# Patient Record
Sex: Female | Born: 1960 | Race: White | Hispanic: No | Marital: Married | Smoking: Never smoker
Health system: Southern US, Community
[De-identification: ages and names within clinical notes are randomized; demographics above are authoritative.]

## PROBLEM LIST (undated history)

## (undated) DIAGNOSIS — A048 Other specified bacterial intestinal infections: Secondary | ICD-10-CM

## (undated) DIAGNOSIS — K279 Peptic ulcer, site unspecified, unspecified as acute or chronic, without hemorrhage or perforation: Secondary | ICD-10-CM

## (undated) DIAGNOSIS — B9681 Helicobacter pylori [H. pylori] as the cause of diseases classified elsewhere: Secondary | ICD-10-CM

## (undated) DIAGNOSIS — F419 Anxiety disorder, unspecified: Secondary | ICD-10-CM

## (undated) DIAGNOSIS — K589 Irritable bowel syndrome without diarrhea: Secondary | ICD-10-CM

## (undated) HISTORY — DX: Other specified bacterial intestinal infections: A04.8

## (undated) HISTORY — DX: Irritable bowel syndrome, unspecified: K58.9

## (undated) HISTORY — DX: Anxiety disorder, unspecified: F41.9

## (undated) HISTORY — DX: Helicobacter pylori (H. pylori) as the cause of diseases classified elsewhere: B96.81

## (undated) HISTORY — DX: Peptic ulcer, site unspecified, unspecified as acute or chronic, without hemorrhage or perforation: K27.9

---

## 1998-05-03 ENCOUNTER — Other Ambulatory Visit: Admission: RE | Admit: 1998-05-03 | Discharge: 1998-05-03 | Payer: Self-pay | Admitting: Obstetrics and Gynecology

## 1999-01-23 ENCOUNTER — Inpatient Hospital Stay (HOSPITAL_COMMUNITY): Admission: AD | Admit: 1999-01-23 | Discharge: 1999-01-25 | Payer: Self-pay | Admitting: Obstetrics and Gynecology

## 1999-04-10 ENCOUNTER — Other Ambulatory Visit: Admission: RE | Admit: 1999-04-10 | Discharge: 1999-04-10 | Payer: Self-pay | Admitting: Obstetrics and Gynecology

## 1999-05-14 ENCOUNTER — Encounter: Payer: Self-pay | Admitting: Gastroenterology

## 1999-05-14 ENCOUNTER — Ambulatory Visit (HOSPITAL_COMMUNITY): Admission: RE | Admit: 1999-05-14 | Discharge: 1999-05-14 | Payer: Self-pay | Admitting: Gastroenterology

## 2000-01-04 ENCOUNTER — Other Ambulatory Visit: Admission: RE | Admit: 2000-01-04 | Discharge: 2000-01-04 | Payer: Self-pay | Admitting: Obstetrics and Gynecology

## 2000-06-17 ENCOUNTER — Other Ambulatory Visit: Admission: RE | Admit: 2000-06-17 | Discharge: 2000-06-17 | Payer: Self-pay | Admitting: *Deleted

## 2001-02-10 ENCOUNTER — Encounter: Admission: RE | Admit: 2001-02-10 | Discharge: 2001-02-10 | Payer: Self-pay | Admitting: Gastroenterology

## 2001-02-10 ENCOUNTER — Encounter: Payer: Self-pay | Admitting: Gastroenterology

## 2001-03-03 ENCOUNTER — Ambulatory Visit (HOSPITAL_COMMUNITY): Admission: RE | Admit: 2001-03-03 | Discharge: 2001-03-03 | Payer: Self-pay | Admitting: Gastroenterology

## 2001-03-03 ENCOUNTER — Encounter: Payer: Self-pay | Admitting: Gastroenterology

## 2001-07-15 ENCOUNTER — Other Ambulatory Visit: Admission: RE | Admit: 2001-07-15 | Discharge: 2001-07-15 | Payer: Self-pay | Admitting: Obstetrics and Gynecology

## 2002-06-02 ENCOUNTER — Other Ambulatory Visit: Admission: RE | Admit: 2002-06-02 | Discharge: 2002-06-02 | Payer: Self-pay | Admitting: Obstetrics and Gynecology

## 2002-06-12 ENCOUNTER — Encounter (INDEPENDENT_AMBULATORY_CARE_PROVIDER_SITE_OTHER): Payer: Self-pay | Admitting: Specialist

## 2002-06-12 ENCOUNTER — Ambulatory Visit (HOSPITAL_COMMUNITY): Admission: RE | Admit: 2002-06-12 | Discharge: 2002-06-12 | Payer: Self-pay | Admitting: Obstetrics and Gynecology

## 2003-06-09 ENCOUNTER — Inpatient Hospital Stay (HOSPITAL_COMMUNITY): Admission: AD | Admit: 2003-06-09 | Discharge: 2003-06-11 | Payer: Self-pay | Admitting: Obstetrics and Gynecology

## 2003-07-19 ENCOUNTER — Other Ambulatory Visit: Admission: RE | Admit: 2003-07-19 | Discharge: 2003-07-19 | Payer: Self-pay | Admitting: Obstetrics and Gynecology

## 2004-10-31 ENCOUNTER — Encounter: Admission: RE | Admit: 2004-10-31 | Discharge: 2004-10-31 | Payer: Self-pay | Admitting: Obstetrics and Gynecology

## 2005-11-27 ENCOUNTER — Encounter: Admission: RE | Admit: 2005-11-27 | Discharge: 2005-11-27 | Payer: Self-pay | Admitting: Obstetrics and Gynecology

## 2005-12-23 ENCOUNTER — Encounter: Admission: RE | Admit: 2005-12-23 | Discharge: 2005-12-23 | Payer: Self-pay | Admitting: Obstetrics and Gynecology

## 2010-05-31 ENCOUNTER — Encounter: Admission: RE | Admit: 2010-05-31 | Discharge: 2010-05-31 | Payer: Self-pay | Admitting: Obstetrics and Gynecology

## 2010-08-26 ENCOUNTER — Encounter: Payer: Self-pay | Admitting: Obstetrics and Gynecology

## 2010-11-08 ENCOUNTER — Other Ambulatory Visit: Payer: Self-pay | Admitting: Obstetrics and Gynecology

## 2010-12-21 NOTE — H&P (Signed)
   NAME:  Cheryl Sims, GREAVES NO.:  [id number]   MEDICAL RECORD NO.:  [id number]                   PATIENT TYPE:  INP   LOCATION:  9164                                 FACILITY:  [facility]   PHYSICIAN:  Lenoard Aden, M.D.             DATE OF BIRTH:  [birth year]   DATE OF ADMISSION:  06/09/2003  DATE OF DISCHARGE:                                HISTORY & PHYSICAL   CHIEF COMPLAINT:  Rule out large for gestational age for induction.   HISTORY OF PRESENT ILLNESS:  The patient is a 50 year old white female, G3,  P1 with an EDD of June 11, 2003, at 39+ weeks, who presents with probable  large for gestational age fetus for induction.   ALLERGIES:  No known drug allergies.   MEDICATIONS:  Prenatal vitamins.   PAST MEDICAL HISTORY:  1. History of a 7-pound female born in June of 200.  2. A missed AB with D&E in November of 2003.  3. Remote history of HPV.  4. Remote history of depression.  5. Vocal cord surgery in 1984.   FAMILY HISTORY:  Ulcerative disease, thyroid disease, and lupus.   PRENATAL LABORATORY DATA:  Blood type O positive.  Rubella immune.  Hepatitis and HIV negative.   PHYSICAL EXAMINATION:  GENERAL APPEARANCE:  She is a well-developed, well-  nourished, white female in no apparent distress.  HEENT:  Normal.  LUNGS:  Clear.  HEART:  Regular rate and rhythm.  ABDOMEN:  Soft, gravid, and nontender.  Estimated fetal weight 8-1/2 pounds.  PELVIC:  The cervix is 2 cm, 50%, vertex, and -2.  EXTREMITIES:  No cords.  NEUROLOGIC:  Exam is nonfocal.   IMPRESSION:  1. 39-week intrauterine pregnancy.  2. Rule out large for gestational age fetus.    PLAN:  Proceed with Pitocin induction.  The risks and benefits were  discussed.  The risks of anesthesia, infection, and bleeding are noted.  The  patient acknowledges and wishes to proceed.                                               Lenoard Aden, M.D.    RJT/MEDQ  D:  06/09/2003  T:   06/09/2003  Job:  [id number]

## 2010-12-21 NOTE — H&P (Signed)
   NAME:  Cheryl Sims, DAME                            ACCOUNT NO.:  [id number]   MEDICAL RECORD NO.:  [id number]                   PATIENT TYPE:  AMB   LOCATION:  SDC                                  FACILITY:  [facility]   PHYSICIAN:  Lenoard Aden, M.D.             DATE OF BIRTH:  05/29/1961   DATE OF ADMISSION:  06/12/2002  DATE OF DISCHARGE:                                HISTORY & PHYSICAL   CHIEF COMPLAINT:  Missed AB at 10 weeks.   HISTORY OF PRESENT ILLNESS:  The patient is a 50 year old white female G2 P1  at [redacted] weeks gestation with an 8-week anembryonic pregnancy and enlarged yolk  sac.   PAST MEDICAL HISTORY:  Spontaneous vaginal delivery in 2000; no other  medical or surgical hospitalizations.  History of cryosurgery in 1991.  History of ulcer treated with medication.   FAMILY HISTORY:  Lupus, hyperglycemia, and tachycardia.   PRENATAL LABORATORIES:  Prenatal blood type reveals a blood type of O  positive.   MEDICATIONS:  Prenatal vitamins.   ALLERGIES:  No known drug allergies.   PHYSICAL EXAMINATION:  GENERAL: Well-developed, well-nourished, white female  in no apparent distress.  HEENT: Normal.  LUNGS: Clear.  HEART: Regular rhythm.  ABDOMEN: Soft, nontender.  PELVIC: Exam reveals an 8-week-sized uterus and no adnexal masses.   IMPRESSION:  Missed abortion.   PLAN:  Proceed with suction D&E.  Risks of anesthesia, infection, bleeding,  injury to abdominal organs, need for repairs discussed, delayed versus  immediate complications to include bowel and bladder injury noted.  The  patient acknowledges and desires to proceed.                                               Lenoard Aden, M.D.    RJT/MEDQ  D:  06/12/2002  T:  06/12/2002  Job:  [id number]   cc:   [facility]

## 2010-12-21 NOTE — Op Note (Signed)
   NAME:  Cheryl Sims, Cheryl Sims                            ACCOUNT NO.:  [id number]   MEDICAL RECORD NO.:  [id number]                   PATIENT TYPE:  AMB   LOCATION:  SDC                                  FACILITY:  [facility]   PHYSICIAN:  Lenoard Aden, M.D.             DATE OF BIRTH:  1960/09/09   DATE OF PROCEDURE:  06/12/2002  DATE OF DISCHARGE:  06/12/2002                                 OPERATIVE REPORT   PREOPERATIVE DIAGNOSIS:  Ten-week intrauterine pregnancy with missed  abortion.   POSTOPERATIVE DIAGNOSIS:  Ten-week intrauterine pregnancy with missed  abortion.   PROCEDURE:  Suction dilatation and evacuation.   SURGEON:  Lenoard Aden, M.D.   ANESTHESIA:  MAC, paracervical.   ESTIMATED BLOOD LOSS:  50 cc.   COMPLICATIONS:  None.   SPECIMENS:  Products of conception to pathology.   DISPOSITION:  To recovery in good condition.   DESCRIPTION OF PROCEDURE:  After being apprised of the risks of anesthesia,  infection, bleeding, injury to abdominal organs and need for repair, the  patient was brought to the operating room, where she was administered IV  sedation without difficulty, prepped and draped in the usual sterile  fashion, and catheterized until the bladder was empty.  After achieving  adequate anesthesia, the bivalve speculum placed, single-tooth tenaculum  used to grasp the anterior lip of the cervix, paracervical block placed  using 20 cc of a dilute Xylocaine solution.  The uterus sounds to 8 cm,  dilated easily up to a #25 Pratt dilator.  Suction curettage performed  without difficulty.  Products of conception noted.  Repeat suction and four-  quadrant curettage confirms the emptiness of the cavity.  Good hemostasis  noted.  All instruments are removed.  Exam under anesthesia reveals a small  midpositioned uterus, no adnexal masses.  The patient tolerates the  procedure well and is transferred to recovery in good condition.              Lenoard Aden, M.D.    RJT/MEDQ  D:  06/12/2002  T:  06/14/2002  Job:  [id number]

## 2011-11-14 ENCOUNTER — Other Ambulatory Visit: Payer: Self-pay | Admitting: Obstetrics and Gynecology

## 2011-11-14 DIAGNOSIS — Z1231 Encounter for screening mammogram for malignant neoplasm of breast: Secondary | ICD-10-CM

## 2011-11-20 ENCOUNTER — Ambulatory Visit
Admission: RE | Admit: 2011-11-20 | Discharge: 2011-11-20 | Disposition: A | Discharge status: Home / Self Care | Payer: BC Managed Care – PPO | Source: Ambulatory Visit | Attending: Obstetrics and Gynecology | Admitting: Obstetrics and Gynecology

## 2011-11-20 DIAGNOSIS — Z1231 Encounter for screening mammogram for malignant neoplasm of breast: Secondary | ICD-10-CM

## 2011-11-27 ENCOUNTER — Ambulatory Visit: Payer: Self-pay

## 2012-12-22 ENCOUNTER — Other Ambulatory Visit: Payer: Self-pay

## 2012-12-22 DIAGNOSIS — Z1231 Encounter for screening mammogram for malignant neoplasm of breast: Secondary | ICD-10-CM

## 2012-12-25 ENCOUNTER — Ambulatory Visit
Admission: RE | Admit: 2012-12-25 | Discharge: 2012-12-25 | Disposition: A | Discharge status: Home / Self Care | Payer: 59 | Source: Ambulatory Visit

## 2012-12-25 DIAGNOSIS — Z1231 Encounter for screening mammogram for malignant neoplasm of breast: Secondary | ICD-10-CM

## 2016-08-19 DIAGNOSIS — E559 Vitamin D deficiency, unspecified: Secondary | ICD-10-CM | POA: Diagnosis not present

## 2016-08-19 DIAGNOSIS — Z13220 Encounter for screening for lipoid disorders: Secondary | ICD-10-CM | POA: Diagnosis not present

## 2016-08-19 DIAGNOSIS — Z0000 Encounter for general adult medical examination without abnormal findings: Secondary | ICD-10-CM | POA: Diagnosis not present

## 2016-08-19 DIAGNOSIS — Z1159 Encounter for screening for other viral diseases: Secondary | ICD-10-CM | POA: Diagnosis not present

## 2016-08-19 DIAGNOSIS — Z79899 Other long term (current) drug therapy: Secondary | ICD-10-CM | POA: Diagnosis not present

## 2016-12-10 DIAGNOSIS — L739 Follicular disorder, unspecified: Secondary | ICD-10-CM | POA: Diagnosis not present

## 2016-12-10 DIAGNOSIS — L0211 Cutaneous abscess of neck: Secondary | ICD-10-CM | POA: Diagnosis not present

## 2016-12-10 DIAGNOSIS — C61 Malignant neoplasm of prostate: Secondary | ICD-10-CM | POA: Diagnosis not present

## 2016-12-10 DIAGNOSIS — L738 Other specified follicular disorders: Secondary | ICD-10-CM | POA: Diagnosis not present

## 2016-12-10 DIAGNOSIS — B9689 Other specified bacterial agents as the cause of diseases classified elsewhere: Secondary | ICD-10-CM | POA: Diagnosis not present

## 2017-04-11 DIAGNOSIS — S60461A Insect bite (nonvenomous) of left index finger, initial encounter: Secondary | ICD-10-CM | POA: Diagnosis not present

## 2017-08-04 DIAGNOSIS — Z1231 Encounter for screening mammogram for malignant neoplasm of breast: Secondary | ICD-10-CM | POA: Diagnosis not present

## 2017-08-04 DIAGNOSIS — Z6820 Body mass index (BMI) 20.0-20.9, adult: Secondary | ICD-10-CM | POA: Diagnosis not present

## 2017-08-04 DIAGNOSIS — Z01419 Encounter for gynecological examination (general) (routine) without abnormal findings: Secondary | ICD-10-CM | POA: Diagnosis not present

## 2017-09-29 DIAGNOSIS — E559 Vitamin D deficiency, unspecified: Secondary | ICD-10-CM | POA: Diagnosis not present

## 2017-09-29 DIAGNOSIS — Z0000 Encounter for general adult medical examination without abnormal findings: Secondary | ICD-10-CM | POA: Diagnosis not present

## 2017-09-29 DIAGNOSIS — Z79899 Other long term (current) drug therapy: Secondary | ICD-10-CM | POA: Diagnosis not present

## 2017-10-07 DIAGNOSIS — D2261 Melanocytic nevi of right upper limb, including shoulder: Secondary | ICD-10-CM | POA: Diagnosis not present

## 2017-10-07 DIAGNOSIS — D225 Melanocytic nevi of trunk: Secondary | ICD-10-CM | POA: Diagnosis not present

## 2017-10-07 DIAGNOSIS — D2262 Melanocytic nevi of left upper limb, including shoulder: Secondary | ICD-10-CM | POA: Diagnosis not present

## 2017-10-21 DIAGNOSIS — D72829 Elevated white blood cell count, unspecified: Secondary | ICD-10-CM | POA: Diagnosis not present

## 2017-10-24 ENCOUNTER — Telehealth: Payer: Self-pay | Admitting: Hematology and Oncology

## 2017-10-24 NOTE — Telephone Encounter (Signed)
Appt has been scheduled for the pt to see Dr. Lebron Conners on 4/1 at 2pm. Pt aware to arrive 30 minutes early.

## 2017-11-03 ENCOUNTER — Encounter: Payer: Self-pay | Admitting: Hematology and Oncology

## 2017-11-03 ENCOUNTER — Inpatient Hospital Stay: Payer: Commercial Managed Care - PPO

## 2017-11-03 ENCOUNTER — Inpatient Hospital Stay: Payer: Commercial Managed Care - PPO | Attending: Hematology and Oncology | Admitting: Hematology and Oncology

## 2017-11-03 ENCOUNTER — Other Ambulatory Visit: Payer: Self-pay

## 2017-11-03 VITALS — BP 128/88 | HR 70 | Temp 98.2°F | Resp 18 | Ht 68.5 in | Wt 137.0 lb

## 2017-11-03 DIAGNOSIS — K279 Peptic ulcer, site unspecified, unspecified as acute or chronic, without hemorrhage or perforation: Secondary | ICD-10-CM | POA: Diagnosis not present

## 2017-11-03 DIAGNOSIS — K589 Irritable bowel syndrome without diarrhea: Secondary | ICD-10-CM | POA: Diagnosis not present

## 2017-11-03 DIAGNOSIS — C9110 Chronic lymphocytic leukemia of B-cell type not having achieved remission: Secondary | ICD-10-CM | POA: Diagnosis present

## 2017-11-03 DIAGNOSIS — B9689 Other specified bacterial agents as the cause of diseases classified elsewhere: Secondary | ICD-10-CM | POA: Insufficient documentation

## 2017-11-03 DIAGNOSIS — Z79899 Other long term (current) drug therapy: Secondary | ICD-10-CM | POA: Diagnosis not present

## 2017-11-03 DIAGNOSIS — D72820 Lymphocytosis (symptomatic): Secondary | ICD-10-CM

## 2017-11-03 DIAGNOSIS — B9681 Helicobacter pylori [H. pylori] as the cause of diseases classified elsewhere: Secondary | ICD-10-CM | POA: Diagnosis not present

## 2017-11-03 DIAGNOSIS — F419 Anxiety disorder, unspecified: Secondary | ICD-10-CM | POA: Diagnosis not present

## 2017-11-03 LAB — CMP (CANCER CENTER ONLY)
ALK PHOS: 63 U/L (ref 40–150)
ALT: 16 U/L (ref 0–55)
ANION GAP: 8 (ref 3–11)
AST: 22 U/L (ref 5–34)
Albumin: 4.5 g/dL (ref 3.5–5.0)
BILIRUBIN TOTAL: 0.6 mg/dL (ref 0.2–1.2)
BUN: 10 mg/dL (ref 7–26)
CALCIUM: 9.8 mg/dL (ref 8.4–10.4)
CO2: 27 mmol/L (ref 22–29)
Chloride: 106 mmol/L (ref 98–109)
Creatinine: 0.75 mg/dL (ref 0.60–1.10)
Glucose, Bld: 82 mg/dL (ref 70–140)
Potassium: 3.8 mmol/L (ref 3.5–5.1)
SODIUM: 141 mmol/L (ref 136–145)
TOTAL PROTEIN: 7.1 g/dL (ref 6.4–8.3)

## 2017-11-03 LAB — CBC WITH DIFFERENTIAL (CANCER CENTER ONLY)
BASOS ABS: 0.1 10*3/uL (ref 0.0–0.1)
BASOS PCT: 0 %
EOS ABS: 0.1 10*3/uL (ref 0.0–0.5)
EOS PCT: 1 %
HCT: 43 % (ref 34.8–46.6)
HEMOGLOBIN: 14.2 g/dL (ref 11.6–15.9)
LYMPHS ABS: 15.4 10*3/uL — AB (ref 0.9–3.3)
Lymphocytes Relative: 79 %
MCH: 29.8 pg (ref 25.1–34.0)
MCHC: 32.9 g/dL (ref 31.5–36.0)
MCV: 90.6 fL (ref 79.5–101.0)
Monocytes Absolute: 0.6 10*3/uL (ref 0.1–0.9)
Monocytes Relative: 3 %
Neutro Abs: 3.2 10*3/uL (ref 1.5–6.5)
Neutrophils Relative %: 17 %
PLATELETS: 297 10*3/uL (ref 145–400)
RBC: 4.75 MIL/uL (ref 3.70–5.45)
RDW: 12.5 % (ref 11.2–14.5)
WBC: 19.4 10*3/uL — AB (ref 3.9–10.3)

## 2017-11-03 LAB — LACTATE DEHYDROGENASE: LDH: 182 U/L (ref 125–245)

## 2017-11-03 LAB — URIC ACID: Uric Acid, Serum: 3 mg/dL (ref 2.6–7.4)

## 2017-11-03 NOTE — Progress Notes (Addendum)
[name] New Visit:  Assessment: Lymphocytosis 57 y.o. female with reasonably limited past medical history, but recent bout of social stress and episode of severe staphylococcal folliculitis in September 2018 who is referred to our clinic for elevated white blood cell count.  Review of the available lab work demonstrates isolated lymphocytic leukocytosis suspicious for possible CLL development.  Plan: -Lab work as outlined below. -CT of the neck/chest/abdomen/pelvis to assess for lymphadenopathy and splenomegaly. -Return to clinic in 2 weeks to review the findings.   Voice recognition software was used and creation of this note. Despite my best effort at editing the text, some misspelling/errors may have occurred. Orders Placed This Encounter  Procedures  . CT Abdomen Pelvis W Contrast    Standing Status:   Future    Standing Expiration Date:   11/03/2018    Order Specific Question:   If indicated for the ordered procedure, I authorize the administration of contrast media per Radiology protocol    Answer:   Yes    Order Specific Question:   Is patient pregnant?    Answer:   No    Order Specific Question:   Preferred imaging location?    Answer:   [facility]    Order Specific Question:   Radiology Contrast Protocol - do NOT remove file path    Answer:   \\[email or web address]    Order Specific Question:   Reason for Exam additional comments    Answer:   Lymphocytosis, possible CLL, please eval for lymphadenopathy and splenomegaly  . CT Chest W Contrast    Standing Status:   Future    Standing Expiration Date:   11/03/2018    Order Specific Question:   If indicated for the ordered procedure, I authorize the administration of contrast media per Radiology protocol    Answer:   Yes    Order Specific Question:   Is patient pregnant?    Answer:   No    Order Specific Question:   Preferred imaging location?    Answer:   [facility]    Order Specific Question:   Radiology Contrast Protocol - do NOT remove file path    Answer:   [email or web address]    Order Specific Question:   Reason for Exam additional comments    Answer:   Lymphocytosis, possible CLL, please eval for lymphadenopathy and splenomegaly  . CT Soft Tissue Neck W Contrast    Standing Status:   Future    Standing Expiration Date:   11/03/2018    Order Specific Question:   If indicated for the ordered procedure, I authorize the administration of contrast media per Radiology protocol    Answer:   Yes    Order Specific Question:   Is patient pregnant?    Answer:   No    Order Specific Question:   Preferred imaging location?    Answer:   [facility]    Order Specific Question:   Radiology Contrast Protocol - do NOT remove file path    Answer:   [email or web address]    Order Specific Question:   Reason for Exam additional comments    Answer:   Lymphocytosis, possible CLL, please eval for lymphadenopathy and splenomegaly  . CBC with Differential (Cancer Center Only)    Standing Status:   Future    Number of Occurrences:   1    Standing Expiration Date:   11/04/2018  . CMP ([facility] only)    Standing  Status:   Future    Number of Occurrences:   1    Standing Expiration Date:   11/04/2018  . Lactate dehydrogenase (LDH)    Standing Status:   Future    Number of Occurrences:   1    Standing Expiration Date:   11/03/2018  . Uric acid    Standing Status:   Future    Number of Occurrences:   1    Standing Expiration Date:   11/03/2018  . Beta 2 microglobulin, serum  . QIG  (Quant. immunoglobulins  - IgG, IgA, IgM)    Standing Status:   Future    Number of Occurrences:   1    Standing Expiration Date:   11/03/2018  . Flow Cytometry    Possible CLL    Standing Status:   Future    Number of Occurrences:   1    Standing Expiration Date:   11/03/2018  . FISH, CLL Prognostic Panel    Standing  Status:   Future    Number of Occurrences:   1    Standing Expiration Date:   11/04/2018  . Chromosome analysis, peripheral blood    Standing Status:   Future    Number of Occurrences:   1    Standing Expiration Date:   11/04/2018    Order Specific Question:   Clinical Indication    Answer:   Possible CLL    Order Specific Question:   Referring Physician Name:    Answer:   Lebron Conners    Order Specific Question:   Patient ID:    Answer:   [id number]  . Hepatitis B surface antibody    Standing Status:   Future    Number of Occurrences:   1    Standing Expiration Date:   11/03/2018  . Hepatitis B surface antigen    Standing Status:   Future    Number of Occurrences:   1    Standing Expiration Date:   11/03/2018  . Hepatitis B core antibody, total    Standing Status:   Future    Number of Occurrences:   1    Standing Expiration Date:   11/03/2018  . Pathologist smear review    Standing Status:   Future    Standing Expiration Date:   11/03/2018    All questions were answered.  . The patient knows to call the clinic with any problems, questions or concerns.  This note was electronically signed.    History of Presenting Illness Cheryl Sims 57 y.o. presenting to the [facility] for elevated white blood cell count evaluation, referred by Dr [name].  Patient's past medical history is significant for anxiety, irritable bowel syndrome, history of peptic ulcer disease positive for H. Pylori.  Elevated white blood cell count was found on routine lab work assessment.  At the present time, patient denies any fevers, chills, night sweats.  She has been feeling more tired than usual, but denies any loss of weight.  Her appetite is not excellent at baseline attributable to long-term habits as well as recent social stress that she has been experiencing including grieving process for her mother who died to 3 years ago, taking care of her father who is diagnosed with progressive Alzheimer's, also  taking care of her brother who has intermittent alcohol abuse problems in addition to job-related stress as well as raising 2 teenagers.  Patient reports developing a severely pruritic rash in September which was eventually diagnosed  as staphylococcal folliculitis and required antibiotic therapy.  Subsequently, rash resolved, but patient now has somewhat recurrent threshold weight is significantly different from the previous presentation.  She has not seen her dermatologist for this problem yet.  Patient had enlarged lymph nodes in the left neck when she had folliculitis, but no recurrent lymphadenopathy presently in the neck, armpits, or groin.  Patient denies abdominal pain or diarrhea.  Oncological/hematological History: **Lymphocytic leukocytosis: --Labs, 08/19/16: WBC   9.8,        Hgb 15.8, plt 262; HCV -- negative --Labs, 09/29/17: WBC 16.9,         Hgb 14.2, Plt 310; TSH 2.06 --Labs, 10/21/17: WBC 15.3, ANC 2.0, ALC 12.4, Mono 0.7, Eos 0.2, Baso 0.0,  Hgb 14.3, Plt 292;  Medical History: Past Medical History:  Diagnosis Date  . Anxiety   . H. pylori infection   . IBS (irritable bowel syndrome)   . Peptic ulcer due to Helicobacter pylori     Surgical History: History reviewed. No pertinent surgical history.  Family History: Family History  Problem Relation Age of Onset  . Alzheimer's disease Father   . Thyroid cancer Brother     Social History: Social History   Socioeconomic History  . Marital status: Married    Spouse name: Not on file  . Number of children: Not on file  . Years of education: Not on file  . Highest education level: Not on file  Occupational History  . Not on file  Social Needs  . Financial resource strain: Not on file  . Food insecurity:    Worry: Not on file    Inability: Not on file  . Transportation needs:    Medical: Not on file    Non-medical: Not on file  Tobacco Use  . Smoking status: Never Smoker  . Smokeless tobacco: Never Used   Substance and Sexual Activity  . Alcohol use: Yes    Frequency: Never  . Drug use: Never  . Sexual activity: Yes  Lifestyle  . Physical activity:    Days per week: Not on file    Minutes per session: Not on file  . Stress: Not on file  Relationships  . Social connections:    Talks on phone: Not on file    Gets together: Not on file    Attends religious service: Not on file    Active member of club or organization: Not on file    Attends meetings of clubs or organizations: Not on file    Relationship status: Not on file  . Intimate partner violence:    Fear of current or ex partner: Not on file    Emotionally abused: Not on file    Physically abused: Not on file    Forced sexual activity: Not on file  Other Topics Concern  . Not on file  Social History Narrative  . Not on file    Allergies: No Known Allergies  Medications:  No current outpatient medications on file.   No current facility-administered medications for this visit.     Review of Systems: Review of Systems  Constitutional: Positive for appetite change and fatigue. Negative for diaphoresis and unexpected weight change.  Skin: Positive for rash.  Hematological: Negative for adenopathy. Does not bruise/bleed easily.  All other systems reviewed and are negative.    PHYSICAL EXAMINATION Blood pressure 128/88, pulse 70, temperature 98.2 F (36.8 C), temperature source Oral, resp. rate 18, height 5' 8.5" (1.74 m), weight 137 lb (62.1  kg), SpO2 100 %.  ECOG PERFORMANCE STATUS: 0 - Asymptomatic  Physical Exam  Constitutional: She is oriented to person, place, and time and well-developed, well-nourished, and in no distress. No distress.  HENT:  Head: Normocephalic and atraumatic.  Mouth/Throat: Oropharynx is clear and moist. No oropharyngeal exudate.  Eyes: Pupils are equal, round, and reactive to light. Conjunctivae and EOM are normal. No scleral icterus.  Neck: No thyromegaly present.  Cardiovascular:  Normal rate, regular rhythm and normal heart sounds.  No murmur heard. Pulmonary/Chest: Effort normal and breath sounds normal. No respiratory distress. She has no wheezes. She has no rales.  Abdominal: Soft. Bowel sounds are normal. She exhibits no distension and no mass. There is no tenderness. There is no guarding.  Musculoskeletal: She exhibits no edema.  Lymphadenopathy:    She has no cervical adenopathy.  Neurological: She is alert and oriented to person, place, and time. She has normal reflexes. No cranial nerve deficit.  Skin: Skin is warm and dry. No rash noted. She is not diaphoretic. No erythema. No pallor.     LABORATORY DATA: I have personally reviewed the data as listed: Appointment on 11/03/2017  Component Date Value Ref Range Status  . WBC Count 11/03/2017 19.4* 3.9 - 10.3 K/uL Final  . RBC 11/03/2017 4.75  3.70 - 5.45 MIL/uL Final  . Hemoglobin 11/03/2017 14.2  11.6 - 15.9 g/dL Final  . HCT 11/03/2017 43.0  34.8 - 46.6 % Final  . MCV 11/03/2017 90.6  79.5 - 101.0 fL Final  . MCH 11/03/2017 29.8  25.1 - 34.0 pg Final  . MCHC 11/03/2017 32.9  31.5 - 36.0 g/dL Final  . RDW 11/03/2017 12.5  11.2 - 14.5 % Final  . Platelet Count 11/03/2017 297  145 - 400 K/uL Final  . Smear Review 11/03/2017 PENDING   Incomplete  . Neutrophils Relative % 11/03/2017 17  % Final  . Neutro Abs 11/03/2017 3.2  1.5 - 6.5 K/uL Final  . Lymphocytes Relative 11/03/2017 79  % Final  . Lymphs Abs 11/03/2017 15.4* 0.9 - 3.3 K/uL Final  . Monocytes Relative 11/03/2017 3  % Final  . Monocytes Absolute 11/03/2017 0.6  0.1 - 0.9 K/uL Final  . Eosinophils Relative 11/03/2017 1  % Final  . Eosinophils Absolute 11/03/2017 0.1  0.0 - 0.5 K/uL Final  . Basophils Relative 11/03/2017 0  % Final  . Basophils Absolute 11/03/2017 0.1  0.0 - 0.1 K/uL Final   Performed at [facility], [place] [address] [place], [place] [address]         Ardath Sax, MD

## 2017-11-03 NOTE — Assessment & Plan Note (Signed)
57 y.o. female with reasonably limited past medical history, but recent bout of social stress and episode of severe staphylococcal folliculitis in September 2018 who is referred to our clinic for elevated white blood cell count.  Review of the available lab work demonstrates isolated lymphocytic leukocytosis suspicious for possible CLL development.  Plan: -Lab work as outlined below. -CT of the neck/chest/abdomen/pelvis to assess for lymphadenopathy and splenomegaly. -Return to clinic in 2 weeks to review the findings.

## 2017-11-04 LAB — IGG, IGA, IGM
IGA: 196 mg/dL (ref 87–352)
IGM (IMMUNOGLOBULIN M), SRM: 43 mg/dL (ref 26–217)
IgG (Immunoglobin G), Serum: 791 mg/dL (ref 700–1600)

## 2017-11-04 LAB — HEPATITIS B CORE ANTIBODY, TOTAL: Hep B Core Total Ab: NEGATIVE

## 2017-11-04 LAB — HEPATITIS B SURFACE ANTIBODY,QUALITATIVE: HEP B S AB: NONREACTIVE

## 2017-11-04 LAB — HEPATITIS B SURFACE ANTIGEN: HEP B S AG: NEGATIVE

## 2017-11-04 LAB — BETA 2 MICROGLOBULIN, SERUM: Beta-2 Microglobulin: 1.3 mg/L (ref 0.6–2.4)

## 2017-11-05 LAB — FLOW CYTOMETRY

## 2017-11-10 LAB — FISH,CLL PROGNOSTIC PANEL

## 2017-11-13 ENCOUNTER — Ambulatory Visit (HOSPITAL_COMMUNITY)
Admission: RE | Admit: 2017-11-13 | Discharge: 2017-11-13 | Disposition: A | Discharge status: Home / Self Care | Payer: Commercial Managed Care - PPO | Source: Ambulatory Visit | Attending: Hematology and Oncology | Admitting: Hematology and Oncology

## 2017-11-13 ENCOUNTER — Ambulatory Visit (HOSPITAL_COMMUNITY): Payer: Commercial Managed Care - PPO

## 2017-11-13 DIAGNOSIS — D72820 Lymphocytosis (symptomatic): Secondary | ICD-10-CM

## 2017-11-13 DIAGNOSIS — I700 Atherosclerosis of aorta: Secondary | ICD-10-CM | POA: Insufficient documentation

## 2017-11-13 DIAGNOSIS — R599 Enlarged lymph nodes, unspecified: Secondary | ICD-10-CM | POA: Diagnosis not present

## 2017-11-13 IMAGING — CT CT CHEST W/ CM
4 of 9 series · 15 of 36 positions shown, 17 images · IV contrast (APPLIED)
Comparison: None.

CLINICAL DATA: Lymphocytosis. Abnormal white blood cells on routine
blood work. CLL workup.

EXAM:
CT CHEST, ABDOMEN, AND PELVIS WITH CONTRAST
TECHNIQUE: Multidetector CT imaging of the chest, abdomen and pelvis was
performed following the standard protocol during bolus
administration of intravenous contrast.
CONTRAST:  100mL OMNIPAQUE IOHEXOL 300 MG/ML  SOLN

[Series 2: cap with · axial · 0.73mm/px · z∈[-437,+3]mm · 5 of 134 slices shown, 7 images]
[im 23/134  mediastinal]
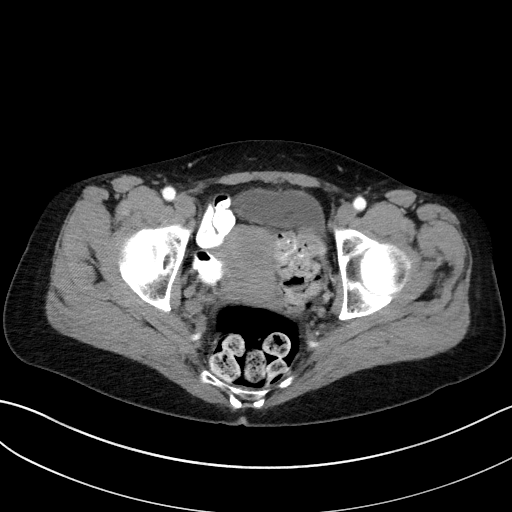
[im 23/134  lung]
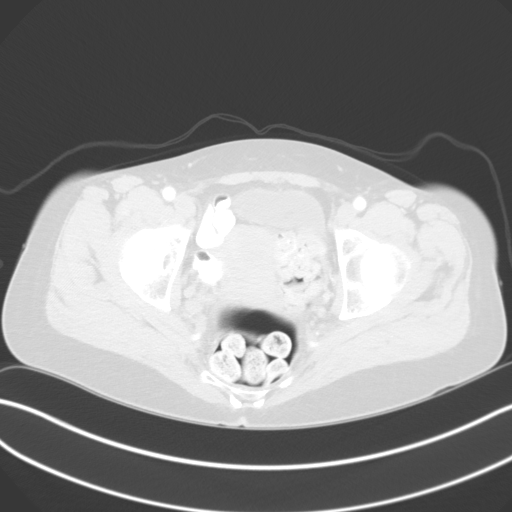
[im 45/134  lung]
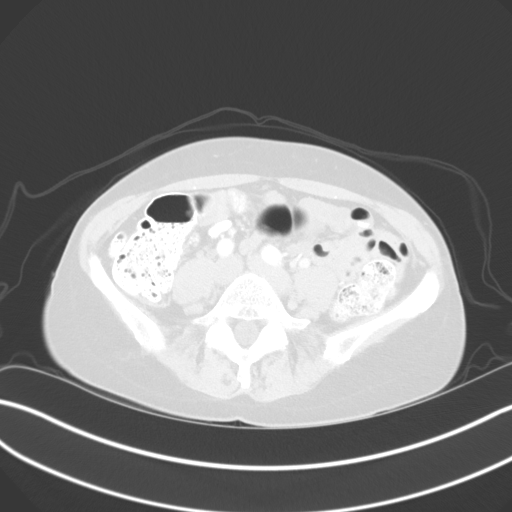
[im 67/134  lung]
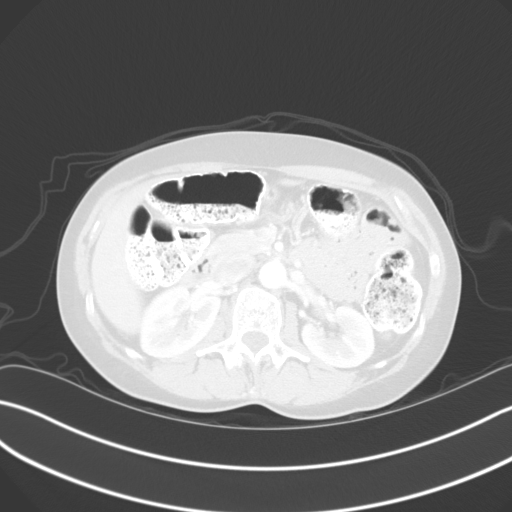
[im 89/134  lung]
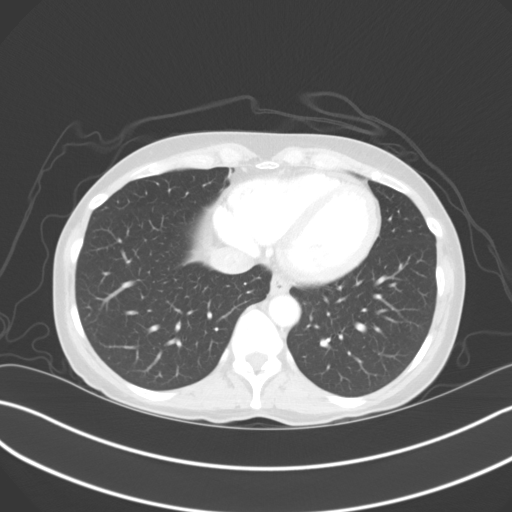
[im 111/134  mediastinal]
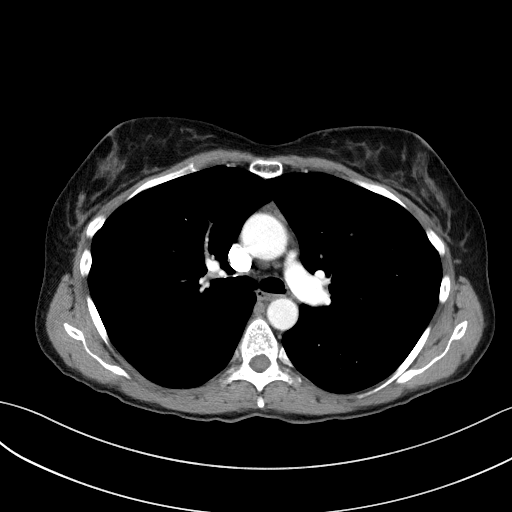
[im 111/134  lung]
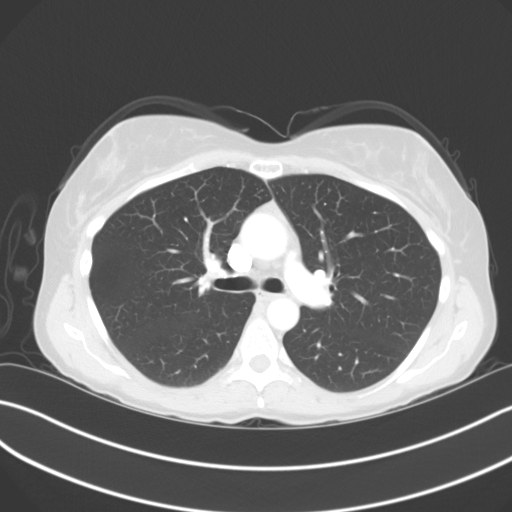

[Series 7: lung · axial · 0.73mm/px · z∈[-147,+53]mm · 5 of 152 slices shown]
[im 26/152  lung]
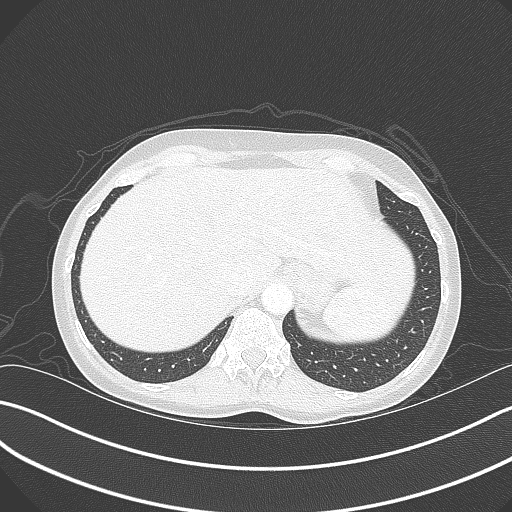
[im 51/152  lung]
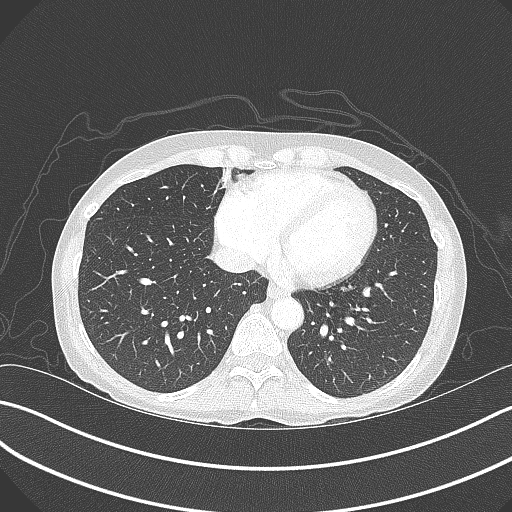
[im 76/152  lung]
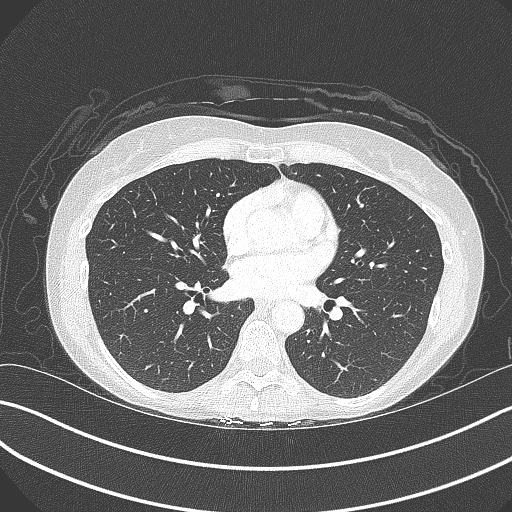
[im 101/152  lung]
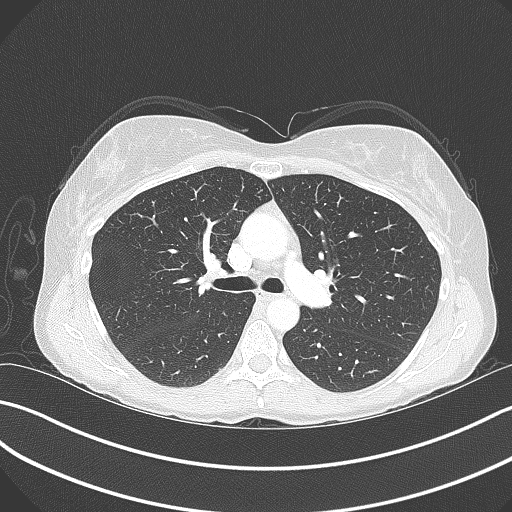
[im 126/152  lung]
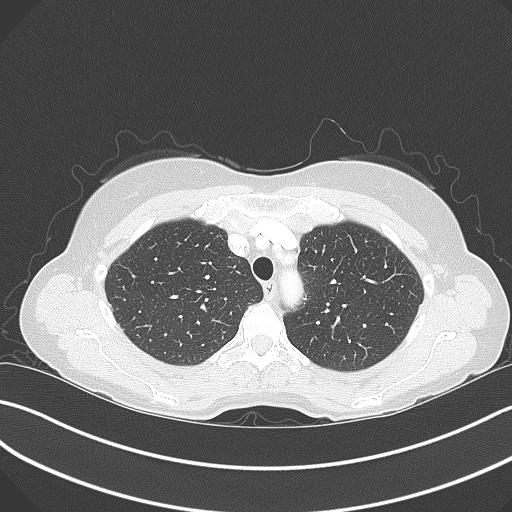

[Series 8: axial neck · axial · 0.42mm/px · z∈[+76,+220]mm · 4 of 121 slices shown]
[im 25/121  lung]
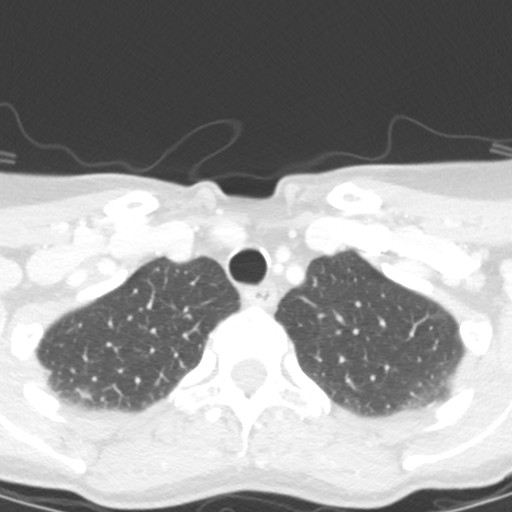
[im 49/121  lung]
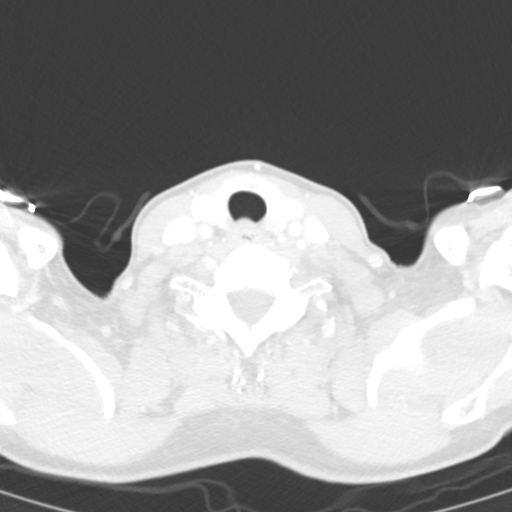
[im 73/121  lung]
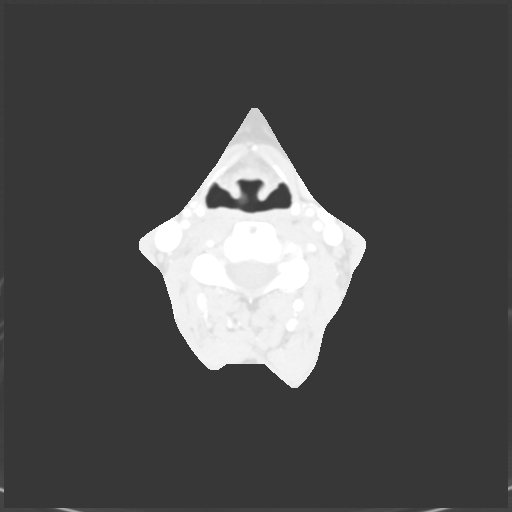
[im 97/121  lung]
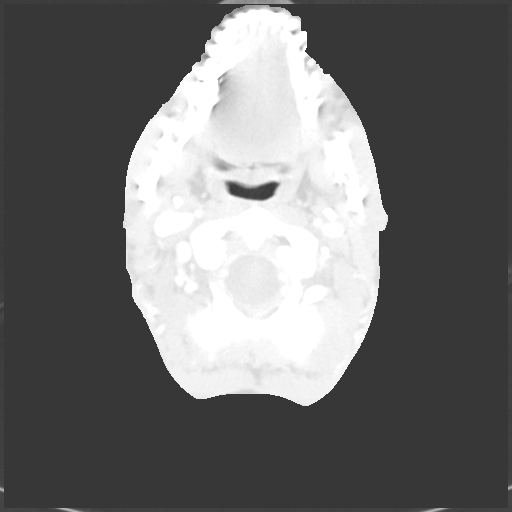

[Series 12: cor neck · coronal · 0.54mm/px · 1 of 84 slices shown]
[im 42/84  lung]
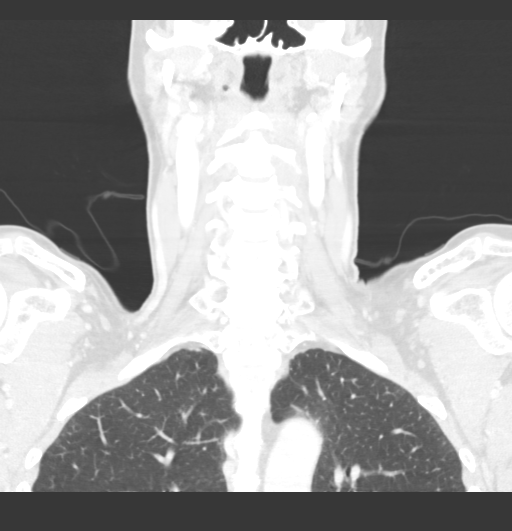

[15 of 36 positions shown; findings below may reference images not displayed]

FINDINGS: CT CHEST FINDINGS

Cardiovascular: The heart is normal in size. No pericardial
effusion. The aorta is normal in caliber. No dissection. The branch
vessels are patent. No coronary artery calcifications. The pulmonary
arteries appear normal.

Mediastinum/Nodes: No mediastinal or hilar mass or lymphadenopathy.
The esophagus is grossly normal.

Lungs/Pleura: No acute pulmonary findings or worrisome pulmonary
lesions. No interstitial lung disease or bronchiectasis.

Musculoskeletal: No breast masses, supraclavicular or axillary
lymphadenopathy. Small scattered lymph nodes are noted. The
visualized thyroid gland is grossly normal.

No significant bony findings.

CT ABDOMEN PELVIS FINDINGS

Hepatobiliary: No focal hepatic lesions or intrahepatic biliary
dilatation. The gallbladder is normal. No common bile duct
dilatation.

Pancreas: No mass, inflammation or ductal dilatation. 4 mm
low-attenuation lesion noted in the body region (series 2, image 64
and series 5, image 39. This is likely a benign postinflammatory
cyst but will require followup.

Spleen: Normal size.  No focal lesions.

Adrenals/Urinary Tract: The adrenal glands, kidneys, ureters and
bladder are unremarkable.

Stomach/Bowel: The stomach, duodenum, small bowel and colon are
unremarkable. No acute inflammatory changes, mass lesions or
obstructive findings. Moderate to large amount of stool throughout
the colon and down into the rectum suggesting constipation. High
transverse cecum noted. The terminal ileum is normal. The appendix
is not identified.

Vascular/Lymphatic: The aorta and branch vessels are patent. The
major venous structures are patent. No mesenteric or retroperitoneal
mass or adenopathy.

No pelvic or inguinal lymphadenopathy. Prominent parametrial vessels
may suggest pelvic congestion syndrome.

Reproductive: The uterus and ovaries are unremarkable.

Other: No inguinal mass or adenopathy. No abdominal wall hernia or
subcutaneous lesions.

Musculoskeletal: No significant bony findings. Moderate degenerative
disc disease noted at L5-S1.
IMPRESSION: 1. No evidence of adenopathy in the chest, abdomen or pelvis.
2. No acute findings or mass lesions in the abdomen/pelvis.
3. No significant pulmonary findings or pulmonary lesions.

## 2017-11-13 MED ORDER — IOHEXOL 300 MG/ML  SOLN
100.0000 mL | Freq: Once | INTRAMUSCULAR | Status: AC | PRN
  Administered 2017-11-13: 100 mL via INTRAVENOUS

## 2017-11-17 ENCOUNTER — Telehealth: Payer: Self-pay

## 2017-11-17 ENCOUNTER — Inpatient Hospital Stay (HOSPITAL_BASED_OUTPATIENT_CLINIC_OR_DEPARTMENT_OTHER): Payer: Commercial Managed Care - PPO | Admitting: Hematology and Oncology

## 2017-11-17 VITALS — BP 132/90 | HR 61 | Temp 98.2°F | Resp 18 | Ht 68.5 in | Wt 136.7 lb

## 2017-11-17 DIAGNOSIS — B9681 Helicobacter pylori [H. pylori] as the cause of diseases classified elsewhere: Secondary | ICD-10-CM | POA: Diagnosis not present

## 2017-11-17 DIAGNOSIS — C9110 Chronic lymphocytic leukemia of B-cell type not having achieved remission: Secondary | ICD-10-CM | POA: Diagnosis not present

## 2017-11-17 DIAGNOSIS — K589 Irritable bowel syndrome without diarrhea: Secondary | ICD-10-CM | POA: Diagnosis not present

## 2017-11-17 NOTE — Telephone Encounter (Signed)
Printed avs and calender of upcoming appointment. Per 4/15 los 

## 2017-12-07 DIAGNOSIS — C9110 Chronic lymphocytic leukemia of B-cell type not having achieved remission: Secondary | ICD-10-CM | POA: Insufficient documentation

## 2017-12-07 NOTE — Assessment & Plan Note (Signed)
57 y.o. female with limited past medical history presently with confirmed new diagnosis of chronic lymphocytic leukemia, Rai stage 0, good prognosis based on isolated presence of bi-allelic 03O deletion.  Patient is asymptomatic.  At this time, no treatment is indicated.  Plan: -Initiate observation. -Return to clinic in 3 months with labs for progression monitoring.  Repeat imaging will be obtained on appearance of new symptoms.

## 2017-12-07 NOTE — Progress Notes (Signed)
[facility] Follow-up Visit:  Assessment: CLL (chronic lymphocytic leukemia) ([place]) 57 y.o. female with limited past medical history presently with confirmed new diagnosis of chronic lymphocytic leukemia, Rai stage 0, good prognosis based on isolated presence of bi-allelic 53Z deletion.  Patient is asymptomatic.  At this time, no treatment is indicated.  Plan: -Initiate observation. -Return to clinic in 3 months with labs for progression monitoring.  Repeat imaging will be obtained on appearance of new symptoms.   Voice recognition software was used and creation of this note. Despite my best effort at editing the text, some misspelling/errors may have occurred.  Orders Placed This Encounter  Procedures  . CBC with Differential (Cancer Center Only)    Standing Status:   Future    Standing Expiration Date:   11/18/2018  . CMP ([place] only)    Standing Status:   Future    Standing Expiration Date:   11/18/2018  . Lactate dehydrogenase (LDH)    Standing Status:   Future    Standing Expiration Date:   11/17/2018  . QIG  (Quant. immunoglobulins  - IgG, IgA, IgM)    Standing Status:   Future    Standing Expiration Date:   11/17/2018    Cancer Staging No matching staging information was found for the patient.  All questions were answered.  . The patient knows to call the clinic with any problems, questions or concerns.  This note was electronically signed.    History of Presenting Illness LOREENA Sims is a 57 y.o. female follwed in the [place] for diagnosis of chronic lymphocytic leukemia, referred by Dr [name].  Patient's past medical history is significant for anxiety, irritable bowel syndrome, history of peptic ulcer disease positive for H. Pylori.  Elevated white blood cell count was found on routine lab work assessment.  At the present time, patient denies any fevers, chills, night sweats.  She has been feeling more tired than usual, but  denies any loss of weight.  Her appetite is not excellent at baseline attributable to long-term habits as well as recent social stress that she has been experiencing including grieving process for her mother who died to 3 years ago, taking care of her father who is diagnosed with progressive Alzheimer's, also taking care of her brother who has intermittent alcohol abuse problems in addition to job-related stress as well as raising 2 teenagers.  Patient reports developing a severely pruritic rash in September which was eventually diagnosed as staphylococcal folliculitis and required antibiotic therapy.  Subsequently, rash resolved, but patient now has somewhat recurrent threshold weight is significantly different from the previous presentation.  She has not seen her dermatologist for this problem yet.  Patient had enlarged lymph nodes in the left neck when she had folliculitis, but no recurrent lymphadenopathy presently in the neck, armpits, or groin.  Patient denies abdominal pain or diarrhea.  Oncological/hematological History: **Chronic lymphocytic leukemia: --Labs, 08/19/16: WBC   9.8,                                                                                      Hgb 15.8, plt 262; HCV --  negative --Labs, 09/29/17: WBC 16.9,                                                                                      Hgb 14.2, Plt 310; TSH 2.06 --Labs, 10/21/17: WBC 15.3, ANC 2.0, ALC 12.4, Mono 0.7, Eos 0.2, Baso 0.0,    Hgb 14.3, Plt 292;     CLL (chronic lymphocytic leukemia) ([place])   11/03/2017 Pathology Results    pBlood Flow Cytometry: Positive for monoclonal lymphoid population comprising 83% of lymphoid events.  Positive for CD20, CD23, and CD5 negative for CD10.  Consistent with chronic lymphocytic leukemia/small lymphocytic lymphoma CLL FISH: negative for trisomy 12, p53 deletion or amplification, ATM deletion.  Positive for [id number] (62% with bi-allelic deletion)      08/08/863 Tumor Marker     WBC 19.4, ALC 15.4, Hgb 14.2, Plt 294 LDH 182, beta-2 microglobulin 1.3; IgG 791      11/13/2017 Imaging    CT N/C/A/P: Increased number without pathological enlargement of lymph nodes in the neck, no pathological lymphadenopathy in the chest, abdomen, pelvis.  No splenomegaly.      11/17/2017 Initial Diagnosis    CLL (chronic lymphocytic leukemia) ([place])      11/17/2017 Cancer Staging    Staging form: Chronic Lymphocytic Leukemia / Small Lymphocytic Lymphoma, AJCC 8th Edition - Clinical stage from 11/17/2017: Modified Rai Stage 0 (Modified Rai risk: Low, Binet: Stage A, Lymphocytosis: Present, Adenopathy: Absent, Organomegaly: Absent, Anemia: Absent, Thrombocytopenia: Absent) - Signed by Ardath Sax, MD on 12/07/2017       Medical History: Past Medical History:  Diagnosis Date  . Anxiety   . H. pylori infection   . IBS (irritable bowel syndrome)   . Peptic ulcer due to Helicobacter pylori     Surgical History: No past surgical history on file.  Family History: Family History  Problem Relation Age of Onset  . Alzheimer's disease Father   . Thyroid cancer Brother     Social History: Social History   Socioeconomic History  . Marital status: Married    Spouse name: Not on file  . Number of children: Not on file  . Years of education: Not on file  . Highest education level: Not on file  Occupational History  . Not on file  Social Needs  . Financial resource strain: Not on file  . Food insecurity:    Worry: Not on file    Inability: Not on file  . Transportation needs:    Medical: Not on file    Non-medical: Not on file  Tobacco Use  . Smoking status: Never Smoker  . Smokeless tobacco: Never Used  Substance and Sexual Activity  . Alcohol use: Yes    Frequency: Never  . Drug use: Never  . Sexual activity: Yes  Lifestyle  . Physical activity:    Days per week: Not on file    Minutes per session: Not on file  . Stress: Not on file  Relationships  .  Social connections:    Talks on phone: Not on file    Gets together: Not on file    Attends religious service: Not on file  Active member of club or organization: Not on file    Attends meetings of clubs or organizations: Not on file    Relationship status: Not on file  . Intimate partner violence:    Fear of current or ex partner: Not on file    Emotionally abused: Not on file    Physically abused: Not on file    Forced sexual activity: Not on file  Other Topics Concern  . Not on file  Social History Narrative  . Not on file    Allergies: No Known Allergies  Medications:  Current Outpatient Medications  Medication Sig Dispense Refill  . ALPRAZolam (XANAX) 0.5 MG tablet TK 1/2 TO 1 T PO TID PRN  0   No current facility-administered medications for this visit.     Review of Systems: Review of Systems  Constitutional: Positive for appetite change and fatigue. Negative for diaphoresis and unexpected weight change.  Skin: Positive for rash.  Hematological: Negative for adenopathy. Does not bruise/bleed easily.  All other systems reviewed and are negative.    PHYSICAL EXAMINATION Blood pressure 132/90, pulse 61, temperature 98.2 F (36.8 C), temperature source Oral, resp. rate 18, height 5' 8.5" (1.74 m), weight 136 lb 11.2 oz (62 kg), SpO2 100 %.  ECOG PERFORMANCE STATUS: 1 - Symptomatic but completely ambulatory  Physical Exam  Constitutional: She is oriented to person, place, and time. She appears well-developed and well-nourished. No distress.  HENT:  Head: Normocephalic and atraumatic.  Mouth/Throat: Oropharynx is clear and moist. No oropharyngeal exudate.  Eyes: Pupils are equal, round, and reactive to light. Conjunctivae and EOM are normal. No scleral icterus.  Neck: No thyromegaly present.  Cardiovascular: Normal rate, regular rhythm, normal heart sounds and intact distal pulses. Exam reveals no gallop and no friction rub.  No murmur heard. Pulmonary/Chest:  Effort normal and breath sounds normal. No stridor. No respiratory distress. She has no wheezes.  Abdominal: Soft. Bowel sounds are normal. She exhibits no distension and no mass. There is no tenderness. There is no guarding.  Musculoskeletal: She exhibits no edema.  Lymphadenopathy:    She has no cervical adenopathy.  Neurological: She is alert and oriented to person, place, and time. She displays normal reflexes. No cranial nerve deficit or sensory deficit.  Skin: Skin is warm and dry. No rash noted. She is not diaphoretic. No erythema. No pallor.     LABORATORY DATA: I have personally reviewed the data as listed: No visits with results within 1 Week(s) from this visit.  Latest known visit with results is:  Clinical Support on 11/03/2017  Component Date Value Ref Range Status  . Hep B Core Total Ab 11/03/2017 Negative  Negative Final   Comment: (NOTE) Performed At: [facility] [place], [place] [id number] Rush Farmer MD [id number] Performed at [facility], [place] [address], [place], [place] [address]   . Hepatitis B Surface Ag 11/03/2017 Negative  Negative Final   Comment: (NOTE) Performed At: [facility] [place], [place] [id number] Rush Farmer MD YB:[id number] Performed at [facility], [place] [address] [place],  [address]   . Hep B S Ab 11/03/2017 Non Reactive   Final   Comment: (NOTE)              Non Reactive: Inconsistent with immunity,  less than 10 mIU/mL              Reactive:     Consistent with immunity,                            greater than 9.9 mIU/mL Performed At: [facility] [place], [place] [id number] Rush Farmer MD NW:[id number] Performed at [facility], [place] [address], [place], [place] [address]   . Fish, CLL 11/03/2017 See Scanned report in [place]   Final    Performed at [facility], [address] [place], [place] [address]  . Flow Cytometry 11/03/2017 See Scanned report in [place]   Final   Performed at [facility], [address] [place], [place] [address]  . IgG (Immunoglobin G), Serum 11/03/2017 791  700 - 1,600 mg/dL Final  . IgA 11/03/2017 196  87 - 352 mg/dL Final  . IgM (Immunoglobulin M), Srm 11/03/2017 43  26 - 217 mg/dL Final   Comment: (NOTE) Performed At: [facility] [place], [place] [id number] Rush Farmer MD ZD:[id number] Performed at [facility], [place] [address] [place], [place] [address]   . Uric Acid, Serum 11/03/2017 3.0  2.6 - 7.4 mg/dL Final   Performed at [facility], [place] [address] [place], [place] [address]  . LDH 11/03/2017 182  125 - 245 U/L Final   Performed at [facility], [place] [address] [place], [place] [address]  . Sodium 11/03/2017 141  136 - 145 mmol/L Final  . Potassium 11/03/2017 3.8  3.5 - 5.1 mmol/L Final  . Chloride 11/03/2017 106  98 - 109 mmol/L Final  . CO2 11/03/2017 27  22 - 29 mmol/L Final  . Glucose, Bld 11/03/2017 82  70 - 140 mg/dL Final  . BUN 11/03/2017 10  7 - 26 mg/dL Final  . Creatinine 11/03/2017 0.75  0.60 - 1.10 mg/dL Final  . Calcium 11/03/2017 9.8  8.4 - 10.4 mg/dL Final  . Total Protein 11/03/2017 7.1  6.4 - 8.3 g/dL Final  . Albumin 11/03/2017 4.5  3.5 - 5.0 g/dL Final  . AST 11/03/2017 22  5 - 34 U/L Final  . ALT 11/03/2017 16  0 - 55 U/L Final  . Alkaline Phosphatase 11/03/2017 63  40 - 150 U/L Final  . Total Bilirubin 11/03/2017 0.6  0.2 - 1.2 mg/dL Final  . GFR, Est Non Af Am 11/03/2017 >60  >60 mL/min Final  . GFR, Est AFR Am 11/03/2017 >60  >60 mL/min Final   Comment: (NOTE) The eGFR has been calculated using the CKD EPI equation. This calculation has not been validated in all clinical  situations. eGFR's persistently <60 mL/min signify possible Chronic Kidney Disease.   Georgiann Hahn gap 11/03/2017 8  3 - 11 Final   Performed at [facility], [place] [address] [place], [place] [address]  . WBC Count 11/03/2017 19.4* 3.9 - 10.3 K/uL Final  . RBC 11/03/2017 4.75  3.70 - 5.45 MIL/uL Final  . Hemoglobin 11/03/2017 14.2  11.6 - 15.9 g/dL Final  . HCT 11/03/2017 43.0  34.8 - 46.6 % Final  . MCV 11/03/2017 90.6  79.5 - 101.0 fL Final  . MCH 11/03/2017 29.8  25.1 - 34.0 pg Final  . MCHC 11/03/2017 32.9  31.5 - 36.0 g/dL Final  .  RDW 11/03/2017 12.5  11.2 - 14.5 % Final  . Platelet Count 11/03/2017 297  145 - 400 K/uL Final  . Smear Review 11/03/2017 VARIANT LYMPHS, SMUDGE CELLS   Final  . Neutrophils Relative % 11/03/2017 17  % Final  . Neutro Abs 11/03/2017 3.2  1.5 - 6.5 K/uL Final  . Lymphocytes Relative 11/03/2017 79  % Final  . Lymphs Abs 11/03/2017 15.4* 0.9 - 3.3 K/uL Final  . Monocytes Relative 11/03/2017 3  % Final  . Monocytes Absolute 11/03/2017 0.6  0.1 - 0.9 K/uL Final  . Eosinophils Relative 11/03/2017 1  % Final  . Eosinophils Absolute 11/03/2017 0.1  0.0 - 0.5 K/uL Final  . Basophils Relative 11/03/2017 0  % Final  . Basophils Absolute 11/03/2017 0.1  0.0 - 0.1 K/uL Final   Performed at [facility], [place] [address], [place], [place] [address]       Ardath Sax, MD

## 2017-12-26 ENCOUNTER — Telehealth: Payer: Self-pay | Admitting: Hematology and Oncology

## 2017-12-26 NOTE — Telephone Encounter (Signed)
Calling pt re appts being moved to [place] - left vm for pt and sending confirmation letter in the mail.

## 2018-02-16 NOTE — Progress Notes (Signed)
HEMATOLOGY/ONCOLOGY CONSULTATION NOTE  Date of Service: 02/17/2018  Patient Care Team: [name], MD as PCP - General (Family Medicine)  CHIEF COMPLAINTS/PURPOSE OF CONSULTATION:  Chronic lymphocytic leukemia   HISTORY OF PRESENTING ILLNESS:   Cheryl Sims is a wonderful 57 y.o. female who has been previously followed by my colleague Dr. Grace Isaac for evaluation and management of Chronic lymphocytic leukemia. She is accompanied today by her husband. The pt reports that she is doing well overall.   The pt was diagnosed with CLL on 11/03/17, Rai stage 0 with an isolated presence of bi-allelic 93X deletion. The pt has been monitored thus far and has not begun any systemic treatments.   The pt reports that she went to the dermatologist last year for "weird" rashes on the back of her neck. She notes that this rash came and went, itched, and went away with antibiotics. She notes that this was believed to be a staph infection or folliculitis.   The pt denies any fatigue that limits her daily activities. She notes that her CLL diagnosis has led to some anxiety about her mortality. She notes that she took Paxel for a few years in her 43s and Welbutrin for a couple years in her 78s. She has a prescription for Xanax which she does not regularly use. The pt and her husband note that they've had unusually stressful events in their family in the last few years. The pt denies much affect to her quality of life or ability to function.   She denies any history of respiratory infections.   Most recent lab results (02/17/18) of CBC w/diff, CMP is as follows: all values are WNL except for WBC at 22.4k, Lymphs abs at 17.7k. LDH 02/17/18 is WNL at 178   On review of systems, pt reports good energy levels, some anxiety, occasional constipation, and denies fevers, chills, night sweats, unexpected weight loss, fatigue, mouth sores, noticing any new lumps or bumps, pain along the spine, abdominal pains,  and any other symptoms.   On PMHx the pt denies respiratory infections or problems. 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   MEDICAL HISTORY:  Past Medical History:  Diagnosis Date  . Anxiety   . H. pylori infection   . IBS (irritable bowel syndrome)   . Peptic ulcer due to Helicobacter pylori     SURGICAL HISTORY: History reviewed. No pertinent surgical history.  SOCIAL HISTORY: Social History   Socioeconomic History  . Marital status: Married    Spouse name: Not on file  . Number of children: Not on file  . Years of education: Not on file  . Highest education level: Not on file  Occupational History  . Not on file  Social Needs  . Financial resource strain: Not on file  . Food insecurity:    Worry: Not on file    Inability: Not on file  . Transportation needs:    Medical: Not on file    Non-medical: Not on file  Tobacco Use  . Smoking status: Never Smoker  . Smokeless tobacco: Never Used  Substance and Sexual Activity  . Alcohol use: Yes    Frequency: Never  . Drug use: Never  . Sexual activity: Yes  Lifestyle  . Physical activity:    Days per week: Not on file    Minutes per session: Not on file  . Stress: Not on file  Relationships  . Social connections:    Talks on  phone: Not on file    Gets together: Not on file    Attends religious service: Not on file    Active member of club or organization: Not on file    Attends meetings of clubs or organizations: Not on file    Relationship status: Not on file  . Intimate partner violence:    Fear of current or ex partner: Not on file    Emotionally abused: Not on file    Physically abused: Not on file    Forced sexual activity: Not on file  Other Topics Concern  . Not on file  Social History Narrative  . Not on file    FAMILY HISTORY: Family History  Problem Relation Age of Onset  . Alzheimer's disease Father   . Thyroid cancer Brother     ALLERGIES:  has No Known  Allergies.  MEDICATIONS:  Current Outpatient Medications  Medication Sig Dispense Refill  . ALPRAZolam (XANAX) 0.5 MG tablet TK 1/2 TO 1 T PO TID PRN  0   No current facility-administered medications for this visit.     REVIEW OF SYSTEMS:    10 Point review of Systems was done is negative except as noted above.  PHYSICAL EXAMINATION: ECOG PERFORMANCE STATUS: 0 - Asymptomatic  . Vitals:   02/17/18 1015  BP: 123/84  Pulse: (!) 56  Resp: 18  Temp: 98.2 F (36.8 C)  SpO2: 100%   Filed Weights   02/17/18 1015  Weight: 138 lb 3.2 oz (62.7 kg)   .Body mass index is 20.71 kg/m.  GENERAL:alert, in no acute distress and comfortable SKIN: no acute rashes, no significant lesions EYES: conjunctiva are pink and non-injected, sclera anicteric OROPHARYNX: MMM, no exudates, no oropharyngeal erythema or ulceration NECK: supple, no JVD LYMPH:  no palpable lymphadenopathy in the cervical, supraclavicular, axillary or inguinal regions LUNGS: clear to auscultation b/l with normal respiratory effort HEART: regular rate & rhythm ABDOMEN:  normoactive bowel sounds , non tender, not distended. Extremity: no pedal edema PSYCH: alert & oriented x 3 with fluent speech NEURO: no focal motor/sensory deficits  LABORATORY DATA:  I have reviewed the data as listed  . CBC Latest Ref Rng & Units 02/17/2018 11/03/2017  WBC 3.9 - 10.3 K/uL 22.4(H) 19.4(H)  Hemoglobin 11.6 - 15.9 g/dL 14.5 14.2  Hematocrit 34.8 - 46.6 % 43.3 43.0  Platelets 145 - 400 K/uL 288 297   . CBC    Component Value Date/Time   WBC 22.4 (H) 02/17/2018 0947   RBC 4.75 02/17/2018 0947   HGB 14.5 02/17/2018 0947   HCT 43.3 02/17/2018 0947   PLT 288 02/17/2018 0947   MCV 91.1 02/17/2018 0947   MCH 30.5 02/17/2018 0947   MCHC 33.4 02/17/2018 0947   RDW 13.5 02/17/2018 0947   LYMPHSABS 17.7 (H) 02/17/2018 0947   MONOABS 0.7 02/17/2018 0947   EOSABS 0.1 02/17/2018 0947   BASOSABS 0.1 02/17/2018 0947    . CMP Latest  Ref Rng & Units 02/17/2018 11/03/2017  Glucose 70 - 99 mg/dL 88 82  BUN 6 - 20 mg/dL 15 10  Creatinine 0.44 - 1.00 mg/dL 0.70 0.75  Sodium 135 - 145 mmol/L 141 141  Potassium 3.5 - 5.1 mmol/L 4.1 3.8  Chloride 98 - 111 mmol/L 106 106  CO2 22 - 32 mmol/L 26 27  Calcium 8.9 - 10.3 mg/dL 9.9 9.8  Total Protein 6.5 - 8.1 g/dL 7.2 7.1  Total Bilirubin 0.3 - 1.2 mg/dL 0.7 0.6  Alkaline Phos 38 -  126 U/L 69 63  AST 15 - 41 U/L 19 22  ALT 0 - 44 U/L 17 16    11/03/17 Peripheral Blood Flow Cytometry:   11/03/17 FISH, CLL Prognostic Panel:   RADIOGRAPHIC STUDIES: I have personally reviewed the radiological images as listed and agreed with the findings in the report. No results found.  ASSESSMENT & PLAN:  57 y.o. female with  1. Chronic lymphocytic leukemia Rai stage 0 11/03/17 Peripheral blood flow cytometry positive for monoclonal lymphoid population comprising 83% of lymphoid events, consistent with CLL.  11/03/17 CLL FISH panel negative for trisomy 12, p53 deletion or amplification, and ATM deletion. Positive for [id number] (24% with bi-allelic deletion) 03/22/58 CT N/C/A/P indicated increased number without pathological enlargement of LN in the neck, no pathological lymphadenopathy in the chest, abdomen of pelvis. No splenomegaly.   PLAN: -Discussed patient's most recent labs from 02/17/18, Lymphs abs minimally increased from 15.4k three months ago to 17.7k today. No anemia. No thrombocytopenia. Blood chemistries are normal.  -Recommend immuno-supportive lifestyle including eating well, sleeping well, and limiting stress as much as reasonably possible -Recommend considering colloidal silver soap for recurrent folliculitis rashes on back of neck -Recommend continuing follow up with dermatologist for skin screenings given association of CLL 13q deletions with non-melanoma skin cancers -Discussed that a lymphocyte doubling time of 3 months would indicate much closer monitoring -Discussed the  criteria that would indicate treatment including unexpected weight loss >10% body mass, blood count changes, debilitating fatigue, fevers, chills, night sweats. -Discussed that the pt does not currently meet any criteria for treatment and we will continue to follow her with labs every 4 months, and if further stability is observed, will space out follow ups every 6 months -Recommend age-appropriate vaccinations including flu, pneumonia and Shingrix  -Will see pt back in 4 months    RTC with Dr Irene Limbo in 4 months with labs   All of the patients questions were answered with apparent satisfaction. The patient knows to call the clinic with any problems, questions or concerns.  The total time spent in the appt was 30 minutes and more than 50% was on counseling and direct patient cares.    Sullivan Lone MD MS AAHIVMS [facility] Hematology/Oncology Physician [facility]  (Office):       [phone] (Work cell):  [phone] (Fax):           [phone]  02/17/2018 11:25 AM  I, Baldwin [place], am acting as a Education administrator for Dr Irene Limbo.   .I have reviewed the above documentation for accuracy and completeness, and I agree with the above. Brunetta Genera MD

## 2018-02-17 ENCOUNTER — Inpatient Hospital Stay: Payer: Commercial Managed Care - PPO

## 2018-02-17 ENCOUNTER — Encounter: Payer: Self-pay | Admitting: Hematology

## 2018-02-17 ENCOUNTER — Inpatient Hospital Stay: Payer: Commercial Managed Care - PPO | Attending: Hematology | Admitting: Hematology

## 2018-02-17 VITALS — BP 123/84 | HR 56 | Temp 98.2°F | Resp 18 | Ht 68.5 in | Wt 138.2 lb

## 2018-02-17 DIAGNOSIS — C9110 Chronic lymphocytic leukemia of B-cell type not having achieved remission: Secondary | ICD-10-CM

## 2018-02-17 DIAGNOSIS — C9190 Lymphoid leukemia, unspecified not having achieved remission: Secondary | ICD-10-CM | POA: Diagnosis not present

## 2018-02-17 DIAGNOSIS — D72820 Lymphocytosis (symptomatic): Secondary | ICD-10-CM

## 2018-02-17 LAB — CBC WITH DIFFERENTIAL (CANCER CENTER ONLY)
BASOS ABS: 0.1 10*3/uL (ref 0.0–0.1)
Basophils Relative: 0 %
Eosinophils Absolute: 0.1 10*3/uL (ref 0.0–0.5)
Eosinophils Relative: 1 %
HCT: 43.3 % (ref 34.8–46.6)
HEMOGLOBIN: 14.5 g/dL (ref 11.6–15.9)
LYMPHS ABS: 17.7 10*3/uL — AB (ref 0.9–3.3)
LYMPHS PCT: 79 %
MCH: 30.5 pg (ref 25.1–34.0)
MCHC: 33.4 g/dL (ref 31.5–36.0)
MCV: 91.1 fL (ref 79.5–101.0)
Monocytes Absolute: 0.7 10*3/uL (ref 0.1–0.9)
Monocytes Relative: 3 %
NEUTROS PCT: 17 %
Neutro Abs: 3.8 10*3/uL (ref 1.5–6.5)
PLATELETS: 288 10*3/uL (ref 145–400)
RBC: 4.75 MIL/uL (ref 3.70–5.45)
RDW: 13.5 % (ref 11.2–14.5)
WBC: 22.4 10*3/uL — AB (ref 3.9–10.3)

## 2018-02-17 LAB — CMP (CANCER CENTER ONLY)
ALT: 17 U/L (ref 0–44)
ANION GAP: 9 (ref 5–15)
AST: 19 U/L (ref 15–41)
Albumin: 4.7 g/dL (ref 3.5–5.0)
Alkaline Phosphatase: 69 U/L (ref 38–126)
BUN: 15 mg/dL (ref 6–20)
CHLORIDE: 106 mmol/L (ref 98–111)
CO2: 26 mmol/L (ref 22–32)
Calcium: 9.9 mg/dL (ref 8.9–10.3)
Creatinine: 0.7 mg/dL (ref 0.44–1.00)
Glucose, Bld: 88 mg/dL (ref 70–99)
POTASSIUM: 4.1 mmol/L (ref 3.5–5.1)
SODIUM: 141 mmol/L (ref 135–145)
Total Bilirubin: 0.7 mg/dL (ref 0.3–1.2)
Total Protein: 7.2 g/dL (ref 6.5–8.1)

## 2018-02-17 LAB — PATHOLOGIST SMEAR REVIEW

## 2018-02-17 LAB — LACTATE DEHYDROGENASE: LDH: 178 U/L (ref 98–192)

## 2018-02-18 ENCOUNTER — Telehealth: Payer: Self-pay

## 2018-02-18 LAB — IGG, IGA, IGM
IGA: 194 mg/dL (ref 87–352)
IGM (IMMUNOGLOBULIN M), SRM: 40 mg/dL (ref 26–217)
IgG (Immunoglobin G), Serum: 795 mg/dL (ref 700–1600)

## 2018-02-18 NOTE — Telephone Encounter (Signed)
Spoke with patient concerning her upcoming scheduled appointments. Per 7/16 los. Mailed letter with calender enclosed

## 2018-04-29 DIAGNOSIS — R6881 Early satiety: Secondary | ICD-10-CM | POA: Diagnosis not present

## 2018-04-29 DIAGNOSIS — Z1211 Encounter for screening for malignant neoplasm of colon: Secondary | ICD-10-CM | POA: Diagnosis not present

## 2018-04-29 DIAGNOSIS — K5900 Constipation, unspecified: Secondary | ICD-10-CM | POA: Diagnosis not present

## 2018-05-19 DIAGNOSIS — R6881 Early satiety: Secondary | ICD-10-CM | POA: Diagnosis not present

## 2018-05-19 DIAGNOSIS — Z1211 Encounter for screening for malignant neoplasm of colon: Secondary | ICD-10-CM | POA: Diagnosis not present

## 2018-06-11 ENCOUNTER — Telehealth: Payer: Self-pay | Admitting: Hematology

## 2018-06-11 NOTE — Telephone Encounter (Signed)
GK out 11/13 - per [place] move lab/fu out three weeks. Spoke with patient re lab/fu 12/11.

## 2018-06-17 ENCOUNTER — Ambulatory Visit: Payer: Commercial Managed Care - PPO | Admitting: Hematology

## 2018-06-17 ENCOUNTER — Other Ambulatory Visit: Payer: Commercial Managed Care - PPO

## 2018-06-22 DIAGNOSIS — R6881 Early satiety: Secondary | ICD-10-CM | POA: Diagnosis not present

## 2018-06-22 DIAGNOSIS — K5900 Constipation, unspecified: Secondary | ICD-10-CM | POA: Diagnosis not present

## 2018-07-15 ENCOUNTER — Inpatient Hospital Stay (HOSPITAL_BASED_OUTPATIENT_CLINIC_OR_DEPARTMENT_OTHER): Payer: Commercial Managed Care - PPO | Admitting: Hematology

## 2018-07-15 ENCOUNTER — Inpatient Hospital Stay: Payer: Commercial Managed Care - PPO | Attending: Hematology

## 2018-07-15 ENCOUNTER — Telehealth: Payer: Self-pay | Admitting: Hematology

## 2018-07-15 VITALS — BP 123/71 | HR 59 | Temp 98.2°F | Resp 18 | Ht 68.5 in | Wt 140.6 lb

## 2018-07-15 DIAGNOSIS — C9110 Chronic lymphocytic leukemia of B-cell type not having achieved remission: Secondary | ICD-10-CM

## 2018-07-15 LAB — CBC WITH DIFFERENTIAL/PLATELET
Abs Immature Granulocytes: 0.03 10*3/uL (ref 0.00–0.07)
Basophils Absolute: 0.1 10*3/uL (ref 0.0–0.1)
Basophils Relative: 0 %
EOS PCT: 1 %
Eosinophils Absolute: 0.1 10*3/uL (ref 0.0–0.5)
HCT: 41.5 % (ref 36.0–46.0)
HEMOGLOBIN: 13.8 g/dL (ref 12.0–15.0)
IMMATURE GRANULOCYTES: 0 %
LYMPHS ABS: 19.7 10*3/uL — AB (ref 0.7–4.0)
LYMPHS PCT: 86 %
MCH: 30.5 pg (ref 26.0–34.0)
MCHC: 33.3 g/dL (ref 30.0–36.0)
MCV: 91.6 fL (ref 80.0–100.0)
MONO ABS: 0.6 10*3/uL (ref 0.1–1.0)
Monocytes Relative: 3 %
Neutro Abs: 2.4 10*3/uL (ref 1.7–7.7)
Neutrophils Relative %: 10 %
Platelets: 290 10*3/uL (ref 150–400)
RBC: 4.53 MIL/uL (ref 3.87–5.11)
RDW: 12.6 % (ref 11.5–15.5)
WBC: 22.9 10*3/uL — ABNORMAL HIGH (ref 4.0–10.5)
nRBC: 0 % (ref 0.0–0.2)

## 2018-07-15 LAB — LACTATE DEHYDROGENASE: LDH: 149 U/L (ref 98–192)

## 2018-07-15 LAB — CMP (CANCER CENTER ONLY)
ALT: 13 U/L (ref 0–44)
AST: 19 U/L (ref 15–41)
Albumin: 4.3 g/dL (ref 3.5–5.0)
Alkaline Phosphatase: 65 U/L (ref 38–126)
Anion gap: 8 (ref 5–15)
BUN: 10 mg/dL (ref 6–20)
CALCIUM: 9.6 mg/dL (ref 8.9–10.3)
CO2: 29 mmol/L (ref 22–32)
Chloride: 106 mmol/L (ref 98–111)
Creatinine: 0.86 mg/dL (ref 0.44–1.00)
GFR, Est AFR Am: >60 mL/min (ref >60)
GFR, Estimated: >60 mL/min (ref >60)
Glucose, Bld: 99 mg/dL (ref 70–99)
Potassium: 4.5 mmol/L (ref 3.5–5.1)
Sodium: 143 mmol/L (ref 135–145)
Total Bilirubin: 0.7 mg/dL (ref 0.3–1.2)
Total Protein: 6.9 g/dL (ref 6.5–8.1)

## 2018-07-15 NOTE — Telephone Encounter (Signed)
Printed calendar and avs. °

## 2018-07-15 NOTE — Progress Notes (Signed)
HEMATOLOGY/ONCOLOGY CONSULTATION NOTE  Date of Service: 07/15/2018  Patient Care Team: Cheryl Jordan, MD as PCP - General (Family Medicine)  CHIEF COMPLAINTS/PURPOSE OF CONSULTATION:  Chronic lymphocytic leukemia   HISTORY OF PRESENTING ILLNESS:   Cheryl Sims is a wonderful 57 y.o. female who has been previously followed by my colleague Dr. Grace Isaac for evaluation and management of Chronic lymphocytic leukemia. She is accompanied today by her husband. The pt reports that she is doing well overall.   The pt was diagnosed with CLL on 11/03/17, Rai stage 0 with an isolated presence of bi-allelic 67Y deletion. The pt has been monitored thus far and has not begun any systemic treatments.   The pt reports that she went to the dermatologist last year for "weird" rashes on the back of her neck. She notes that this rash came and went, itched, and went away with antibiotics. She notes that this was believed to be a staph infection or folliculitis.   The pt denies any fatigue that limits her daily activities. She notes that her CLL diagnosis has led to some anxiety about her mortality. She notes that she took Paxel for a few years in her 10s and Welbutrin for a couple years in her 44s. She has a prescription for Xanax which she does not regularly use. The pt and her husband note that they've had unusually stressful events in their family in the last few years. The pt denies much affect to her quality of life or ability to function.   She denies any history of respiratory infections.   Most recent lab results (02/17/18) of CBC w/diff, CMP is as follows: all values are WNL except for WBC at 22.4k, Lymphs abs at 17.7k. LDH 02/17/18 is WNL at 178   On review of systems, pt reports good energy levels, some anxiety, occasional constipation, and denies fevers, chills, night sweats, unexpected weight loss, fatigue, mouth sores, noticing any new lumps or bumps, pain along the spine, abdominal pains,  and any other symptoms.   On PMHx the pt denies respiratory infections or problems. 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  Interval History:   Cheryl Sims returns today for management and evaluation of her CLL. The patient's last visit with [name] was on 02/17/18. The pt reports that she is doing well overall.   The pt reports that she has not developed any new concerns. She notes that she is currently working two jobs and that this is causing her to feel tired, but otherwise has not experienced any new or debilitating fatigue. She denies fevers, chills, night sweats, or concerns for infections. She denies abdominal pains and has not noticed any lumps or bumps.  Lab results today (07/15/18) of CBC w/diff and CMP is as follows: all values are WNL except for WBC at 22.9k, Lymphs abs at 19.7k. 07/15/18 LDH at 149  On review of systems, pt reports work-related tiredness, and denies fevers, chills, night sweats, unexpected weight loss, concerns for infections, new lumps or bumps, abdominal pains, back pains, leg swelling, and any other symptoms.   MEDICAL HISTORY:  Past Medical History:  Diagnosis Date  . Anxiety   . H. pylori infection   . IBS (irritable bowel syndrome)   . Peptic ulcer due to Helicobacter pylori     SURGICAL HISTORY: No past surgical history on file.  SOCIAL HISTORY: Social History   Socioeconomic History  . Marital status: Married    Spouse name:  Not on file  . Number of children: Not on file  . Years of education: Not on file  . Highest education level: Not on file  Occupational History  . Not on file  Social Needs  . Financial resource strain: Not on file  . Food insecurity:    Worry: Not on file    Inability: Not on file  . Transportation needs:    Medical: Not on file    Non-medical: Not on file  Tobacco Use  . Smoking status: Never Smoker  . Smokeless tobacco: Never Used  Substance and Sexual Activity  . Alcohol use: Yes     Frequency: Never  . Drug use: Never  . Sexual activity: Yes  Lifestyle  . Physical activity:    Days per week: Not on file    Minutes per session: Not on file  . Stress: Not on file  Relationships  . Social connections:    Talks on phone: Not on file    Gets together: Not on file    Attends religious service: Not on file    Active member of club or organization: Not on file    Attends meetings of clubs or organizations: Not on file    Relationship status: Not on file  . Intimate partner violence:    Fear of current or ex partner: Not on file    Emotionally abused: Not on file    Physically abused: Not on file    Forced sexual activity: Not on file  Other Topics Concern  . Not on file  Social History Narrative  . Not on file    FAMILY HISTORY: Family History  Problem Relation Age of Onset  . Alzheimer's disease Father   . Thyroid cancer Brother     ALLERGIES:  has No Known Allergies.  MEDICATIONS:  Current Outpatient Medications  Medication Sig Dispense Refill  . ALPRAZolam (XANAX) 0.5 MG tablet TK 1/2 TO 1 T PO TID PRN  0   No current facility-administered medications for this visit.     REVIEW OF SYSTEMS:    A 10+ POINT REVIEW OF SYSTEMS WAS OBTAINED including neurology, dermatology, psychiatry, cardiac, respiratory, lymph, extremities, GI, GU, Musculoskeletal, constitutional, breasts, reproductive, HEENT.  All pertinent positives are noted in the HPI.  All others are negative.   PHYSICAL EXAMINATION: ECOG PERFORMANCE STATUS: 0 - Asymptomatic  . Vitals:   07/15/18 1442  BP: 123/71  Pulse: (!) 59  Resp: 18  Temp: 98.2 F (36.8 C)  SpO2: 100%   Filed Weights   07/15/18 1442  Weight: 140 lb 9.6 oz (63.8 kg)   .Body mass index is 21.07 kg/m.  GENERAL:alert, in no acute distress and comfortable SKIN: no acute rashes, no significant lesions EYES: conjunctiva are pink and non-injected, sclera anicteric OROPHARYNX: MMM, no exudates, no oropharyngeal  erythema or ulceration NECK: supple, no JVD LYMPH:  no palpable lymphadenopathy in the cervical, axillary or inguinal regions LUNGS: clear to auscultation b/l with normal respiratory effort HEART: regular rate & rhythm ABDOMEN:  normoactive bowel sounds , non tender, not distended. No palpable hepatosplenomegaly.  Extremity: no pedal edema PSYCH: alert & oriented x 3 with fluent speech NEURO: no focal motor/sensory deficits   LABORATORY DATA:  I have reviewed the data as listed  . CBC Latest Ref Rng & Units 07/15/2018 02/17/2018 11/03/2017  WBC 4.0 - 10.5 K/uL 22.9(H) 22.4(H) 19.4(H)  Hemoglobin 12.0 - 15.0 g/dL 13.8 14.5 14.2  Hematocrit 36.0 - 46.0 % 41.5 43.3 43.0  Platelets 150 - 400 K/uL 290 288 297   . CBC    Component Value Date/Time   WBC 22.9 (H) 07/15/2018 1401   RBC 4.53 07/15/2018 1401   HGB 13.8 07/15/2018 1401   HGB 14.5 02/17/2018 0947   HCT 41.5 07/15/2018 1401   PLT 290 07/15/2018 1401   PLT 288 02/17/2018 0947   MCV 91.6 07/15/2018 1401   MCH 30.5 07/15/2018 1401   MCHC 33.3 07/15/2018 1401   RDW 12.6 07/15/2018 1401   LYMPHSABS 19.7 (H) 07/15/2018 1401   MONOABS 0.6 07/15/2018 1401   EOSABS 0.1 07/15/2018 1401   BASOSABS 0.1 07/15/2018 1401    . CMP Latest Ref Rng & Units 07/15/2018 02/17/2018 11/03/2017  Glucose 70 - 99 mg/dL 99 88 82  BUN 6 - 20 mg/dL '10 15 10  '$ Creatinine 0.44 - 1.00 mg/dL 0.86 0.70 0.75  Sodium 135 - 145 mmol/L 143 141 141  Potassium 3.5 - 5.1 mmol/L 4.5 4.1 3.8  Chloride 98 - 111 mmol/L 106 106 106  CO2 22 - 32 mmol/L '29 26 27  '$ Calcium 8.9 - 10.3 mg/dL 9.6 9.9 9.8  Total Protein 6.5 - 8.1 g/dL 6.9 7.2 7.1  Total Bilirubin 0.3 - 1.2 mg/dL 0.7 0.7 0.6  Alkaline Phos 38 - 126 U/L 65 69 63  AST 15 - 41 U/L '19 19 22  '$ ALT 0 - 44 U/L '13 17 16    '$ 11/03/17 Peripheral Blood Flow Cytometry:   11/03/17 FISH, CLL Prognostic Panel:   RADIOGRAPHIC STUDIES: I have personally reviewed the radiological images as listed and agreed with the  findings in the report. No results found.  ASSESSMENT & PLAN:  57 y.o. female with  1. Chronic lymphocytic leukemia Rai stage 0 11/03/17 Peripheral blood flow cytometry positive for monoclonal lymphoid population comprising 83% of lymphoid events, consistent with CLL.  11/03/17 CLL FISH panel negative for trisomy 12, p53 deletion or amplification, and ATM deletion. Positive for [id number] (17% with bi-allelic deletion) 12/13/23 CT N/C/A/P indicated increased number without pathological enlargement of LN in the neck, no pathological lymphadenopathy in the chest, abdomen of pelvis. No splenomegaly.   PLAN: -Discussed pt labwork today, 07/15/18; WBC have increased marginally over the last 4 months from 22.4k to 22.9k, no anemia, normal PLT at 290k, LDH normal at 149, Chemistries normal -Discussed that the patient's 13q deletion prognosis is continuing in its expected indolent course -Discussed that the patient has not developed any constitutional symptoms, enlarged lymph nodes, or enlarged spleen -Again discussed the symptoms the patient should monitor for including: fevers, chills, disproportionate fatigue, significant unexpected weight loss, and drenching night sweats  -Recommend immuno-supportive lifestyle including eating well, sleeping well, and limiting stress as much as reasonably possible -Recommend continuing follow up with dermatologist for skin screenings given association of CLL 13q deletions with non-melanoma skin cancers -Recommend age-appropriate vaccinations including annual flu, both every 5-year pneumonia, and Shingrix  -Will space out the patient's follow ups to every 6 months, sooner if any new concerns    RTC with Dr Irene Limbo with labs in 6 months   All of the patients questions were answered with apparent satisfaction. The patient knows to call the clinic with any problems, questions or concerns.  The total time spent in the appt was 20 minutes and more than 50% was on counseling  and direct patient cares.    Sullivan Lone MD [place] AAHIVMS [facility] Hematology/Oncology Physician [place]  (Office):  [phone] (Work cell):  [phone] (Fax):           [phone]  07/15/2018 3:21 PM  I, Baldwin [place], am acting as a scribe for Dr. Sullivan Lone.   .I have reviewed the above documentation for accuracy and completeness, and I agree with the above. Brunetta Genera MD

## 2018-08-06 DIAGNOSIS — Z6820 Body mass index (BMI) 20.0-20.9, adult: Secondary | ICD-10-CM | POA: Diagnosis not present

## 2018-08-06 DIAGNOSIS — Z01419 Encounter for gynecological examination (general) (routine) without abnormal findings: Secondary | ICD-10-CM | POA: Diagnosis not present

## 2018-08-06 DIAGNOSIS — Z1231 Encounter for screening mammogram for malignant neoplasm of breast: Secondary | ICD-10-CM | POA: Diagnosis not present

## 2019-01-13 ENCOUNTER — Telehealth: Payer: Self-pay | Admitting: *Deleted

## 2019-01-13 NOTE — Telephone Encounter (Signed)
Patient called to inform office that labs from PCP visit had been faxed, labs received. Patient verified time of appt on 6/11.

## 2019-01-13 NOTE — Progress Notes (Signed)
HEMATOLOGY/ONCOLOGY CONSULTATION NOTE  Date of Service: 01/14/2019  Patient Care Team: [name], MD as PCP - General (Family Medicine)  CHIEF COMPLAINTS/PURPOSE OF CONSULTATION:  Chronic lymphocytic leukemia   HISTORY OF PRESENTING ILLNESS:   Cheryl Sims is a wonderful 58 y.o. female who has been previously followed by my colleague Dr. Grace Isaac for evaluation and management of Chronic lymphocytic leukemia. She is accompanied today by her husband. The pt reports that she is doing well overall.   The pt was diagnosed with CLL on 11/03/17, Rai stage 0 with an isolated presence of bi-allelic 00Q deletion. The pt has been monitored thus far and has not begun any systemic treatments.   The pt reports that she went to the dermatologist last year for "weird" rashes on the back of her neck. She notes that this rash came and went, itched, and went away with antibiotics. She notes that this was believed to be a staph infection or folliculitis.   The pt denies any fatigue that limits her daily activities. She notes that her CLL diagnosis has led to some anxiety about her mortality. She notes that she took Paxel for a few years in her 6s and Welbutrin for a couple years in her 43s. She has a prescription for Xanax which she does not regularly use. The pt and her husband note that they've had unusually stressful events in their family in the last few years. The pt denies much affect to her quality of life or ability to function.   She denies any history of respiratory infections.   Most recent lab results (02/17/18) of CBC w/diff, CMP is as follows: all values are WNL except for WBC at 22.4k, Lymphs abs at 17.7k. LDH 02/17/18 is WNL at 178   On review of systems, pt reports good energy levels, some anxiety, occasional constipation, and denies fevers, chills, night sweats, unexpected weight loss, fatigue, mouth sores, noticing any new lumps or bumps, pain along the spine, abdominal pains,  and any other symptoms.   On PMHx the pt denies respiratory infections or problems. 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  Interval History:   Cheryl Sims returns today for management and evaluation of her CLL. The patient's last visit with [name] was on 07/15/18. The pt reports that she is doing well overall.  The pt reports that she has remained active in the interim with yard work, gardening, and working out. She has recently had contact dermatitis from poison ivy. She has been socially distancing and denies concerns for infections. She denies any fevers, chills, drenching night sweats, or unexpected weight loss. The pt also denies noticing any new lumps or bumps.   The pt notes that she has been consuming more coffee recently and wonders if this is related to her new sense of intermittent heart flutters. She denies CP, chest tightness, perspiration or SOB. Does associate this with anxiety and "feeling jittery," and takes Xanax "very rarely." The pt also suspects she is dehydrated. The pt also notes that she is post-menopausal.  Lab results today (01/14/19) of CBC w/diff and CMP is as follows: all values are WNL except for WBC at 26.6k, Lymphs abs at 23.2k. 01/14/19 LDH is . Lab Results  Component Value Date   LDH 164 01/14/2019   On review of systems, pt reports good energy levels, staying active, stable weight, good appetite, occasional heart flutters, and denies fevers, chills, night sweats, unexpected weight loss, noticing any  new lumps or bumps, chest tightness, CP, SOB, skin lesions not otherwise related to recent contact dermatitis, abdominal pains, leg swelling, and any other symptoms.   MEDICAL HISTORY:  Past Medical History:  Diagnosis Date  . Anxiety   . H. pylori infection   . IBS (irritable bowel syndrome)   . Peptic ulcer due to Helicobacter pylori     SURGICAL HISTORY: No past surgical history on file.  SOCIAL HISTORY: Social History    Socioeconomic History  . Marital status: Married    Spouse name: Not on file  . Number of children: Not on file  . Years of education: Not on file  . Highest education level: Not on file  Occupational History  . Not on file  Social Needs  . Financial resource strain: Not on file  . Food insecurity    Worry: Not on file    Inability: Not on file  . Transportation needs    Medical: Not on file    Non-medical: Not on file  Tobacco Use  . Smoking status: Never Smoker  . Smokeless tobacco: Never Used  Substance and Sexual Activity  . Alcohol use: Yes    Frequency: Never  . Drug use: Never  . Sexual activity: Yes  Lifestyle  . Physical activity    Days per week: Not on file    Minutes per session: Not on file  . Stress: Not on file  Relationships  . Social connections    Talks on phone: Not on file    Gets together: Not on file    Attends religious service: Not on file    Active member of club or organization: Not on file    Attends meetings of clubs or organizations: Not on file    Relationship status: Not on file  . Intimate partner violence    Fear of current or ex partner: Not on file    Emotionally abused: Not on file    Physically abused: Not on file    Forced sexual activity: Not on file  Other Topics Concern  . Not on file  Social History Narrative  . Not on file    FAMILY HISTORY: Family History  Problem Relation Age of Onset  . Alzheimer's disease Father   . Thyroid cancer Brother     ALLERGIES:  has No Known Allergies.  MEDICATIONS:  Current Outpatient Medications  Medication Sig Dispense Refill  . ALPRAZolam (XANAX) 0.5 MG tablet TK 1/2 TO 1 T PO TID PRN  0   No current facility-administered medications for this visit.     REVIEW OF SYSTEMS:    A 10+ POINT REVIEW OF SYSTEMS WAS OBTAINED including neurology, dermatology, psychiatry, cardiac, respiratory, lymph, extremities, GI, GU, Musculoskeletal, constitutional, breasts, reproductive, HEENT.   All pertinent positives are noted in the HPI.  All others are negative.   PHYSICAL EXAMINATION: ECOG PERFORMANCE STATUS: 0 - Asymptomatic  . Vitals:   01/14/19 1501  BP: 137/84  Pulse: 65  Resp: 18  Temp: 98.9 F (37.2 C)  SpO2: 100%   Filed Weights   01/14/19 1501  Weight: 139 lb 14.4 oz (63.5 kg)   .Body mass index is 20.96 kg/m.  GENERAL:alert, in no acute distress and comfortable SKIN: no acute rashes, no significant lesions EYES: conjunctiva are pink and non-injected, sclera anicteric OROPHARYNX: MMM, no exudates, no oropharyngeal erythema or ulceration NECK: supple, no JVD LYMPH:  no palpable lymphadenopathy in the cervical, axillary or inguinal regions LUNGS: clear to auscultation b/l   with normal respiratory effort HEART: regular rate & rhythm ABDOMEN:  normoactive bowel sounds , non tender, not distended. No palpable hepatosplenomegaly.  Extremity: no pedal edema PSYCH: alert & oriented x 3 with fluent speech NEURO: no focal motor/sensory deficits   LABORATORY DATA:  I have reviewed the data as listed  . CBC Latest Ref Rng & Units 01/14/2019 07/15/2018 02/17/2018  WBC 4.0 - 10.5 K/uL 26.6(H) 22.9(H) 22.4(H)  Hemoglobin 12.0 - 15.0 g/dL 14.8 13.8 14.5  Hematocrit 36.0 - 46.0 % 45.0 41.5 43.3  Platelets 150 - 400 K/uL 313 290 288   . CBC    Component Value Date/Time   WBC 26.6 (H) 01/14/2019 1433   RBC 4.80 01/14/2019 1433   HGB 14.8 01/14/2019 1433   HGB 14.5 02/17/2018 0947   HCT 45.0 01/14/2019 1433   PLT 313 01/14/2019 1433   PLT 288 02/17/2018 0947   MCV 93.8 01/14/2019 1433   MCH 30.8 01/14/2019 1433   MCHC 32.9 01/14/2019 1433   RDW 12.6 01/14/2019 1433   LYMPHSABS 23.2 (H) 01/14/2019 1433   MONOABS 0.6 01/14/2019 1433   EOSABS 0.2 01/14/2019 1433   BASOSABS 0.1 01/14/2019 1433    . CMP Latest Ref Rng & Units 01/14/2019 07/15/2018 02/17/2018  Glucose 70 - 99 mg/dL 94 99 88  BUN 6 - 20 mg/dL 12 10 15  Creatinine 0.44 - 1.00 mg/dL 0.75 0.86  0.70  Sodium 135 - 145 mmol/L 141 143 141  Potassium 3.5 - 5.1 mmol/L 4.3 4.5 4.1  Chloride 98 - 111 mmol/L 104 106 106  CO2 22 - 32 mmol/L 27 29 26  Calcium 8.9 - 10.3 mg/dL 9.8 9.6 9.9  Total Protein 6.5 - 8.1 g/dL 7.4 6.9 7.2  Total Bilirubin 0.3 - 1.2 mg/dL 0.4 0.7 0.7  Alkaline Phos 38 - 126 U/L 77 65 69  AST 15 - 41 U/L 19 19 19  ALT 0 - 44 U/L 14 13 17    11/03/17 Peripheral Blood Flow Cytometry:   11/03/17 FISH, CLL Prognostic Panel:   RADIOGRAPHIC STUDIES: I have personally reviewed the radiological images as listed and agreed with the findings in the report. No results found.  ASSESSMENT & PLAN:  58 y.o. female with  1. Chronic lymphocytic leukemia Rai stage 0 11/03/17 Peripheral blood flow cytometry positive for monoclonal lymphoid population comprising 83% of lymphoid events, consistent with CLL.  11/03/17 CLL FISH panel negative for trisomy 12, p53 deletion or amplification, and ATM deletion. Positive for [id number] (36% with bi-allelic deletion) 11/13/17 CT N/C/A/P indicated increased number without pathological enlargement of LN in the neck, no pathological lymphadenopathy in the chest, abdomen of pelvis. No splenomegaly.   PLAN: -Discussed pt labwork today, 01/14/19; blood counts are stable overall. WBC have increased slightly over the last six months from 22.9k to 26.6k. Lymphs abs at 23.2k. No other blood count abnormalities. -01/14/19 LDH is wnl. -Discussed that the patient's blood counts continue to progress slowly as expected with her 13q deletion -Discussed again the constitutional symptoms to be mindful for -The pt shows no clinical or lab progression of her CLL at this time.  -No indication for further treatment at this time. -Discussed that the patient has not developed any constitutional symptoms, enlarged lymph nodes, or enlarged spleen -Recommend immuno-supportive lifestyle including eating well, sleeping well, and limiting stress as much as reasonably possible  -Recommend continuing follow up with dermatologist for skin screenings given association of CLL 13q deletions with non-melanoma skin cancers -Recommend age-appropriate vaccinations   including annual flu, both every 5-year pneumonia, and Shingrix  -Recommended that the pt continue to eat well, drink at least 48-64 oz of water each day, and walk 20-30 minutes each day. -Will see the pt back in 6 months   RTC with Dr Kale with labs in 6 months   All of the patients questions were answered with apparent satisfaction. The patient knows to call the clinic with any problems, questions or concerns.  The total time spent in the appt was 15 minutes and more than 50% was on counseling and direct patient cares.    Gautam Kale MD [place] AAHIVMS [facility] Hematology/Oncology Physician [facility]  (Office):       [phone] (Work cell):  [phone] (Fax):           [phone]  01/14/2019 3:42 PM  I, Schuyler Bain, am acting as a scribe for Dr. Gautam Kale.   .I have reviewed the above documentation for accuracy and completeness, and I agree with the above. .Gautam Kishore Kale MD  

## 2019-01-14 ENCOUNTER — Other Ambulatory Visit: Payer: Self-pay

## 2019-01-14 ENCOUNTER — Inpatient Hospital Stay (HOSPITAL_BASED_OUTPATIENT_CLINIC_OR_DEPARTMENT_OTHER): Payer: Commercial Managed Care - PPO | Admitting: Hematology

## 2019-01-14 ENCOUNTER — Inpatient Hospital Stay: Payer: Commercial Managed Care - PPO | Attending: Hematology

## 2019-01-14 VITALS — BP 137/84 | HR 65 | Temp 98.9°F | Resp 18 | Ht 68.5 in | Wt 139.9 lb

## 2019-01-14 DIAGNOSIS — C9110 Chronic lymphocytic leukemia of B-cell type not having achieved remission: Secondary | ICD-10-CM | POA: Insufficient documentation

## 2019-01-14 LAB — CBC WITH DIFFERENTIAL/PLATELET
Abs Immature Granulocytes: 0.03 10*3/uL (ref 0.00–0.07)
Basophils Absolute: 0.1 10*3/uL (ref 0.0–0.1)
Basophils Relative: 0 %
Eosinophils Absolute: 0.2 10*3/uL (ref 0.0–0.5)
Eosinophils Relative: 1 %
HCT: 45 % (ref 36.0–46.0)
Hemoglobin: 14.8 g/dL (ref 12.0–15.0)
Immature Granulocytes: 0 %
Lymphocytes Relative: 88 %
Lymphs Abs: 23.2 10*3/uL — ABNORMAL HIGH (ref 0.7–4.0)
MCH: 30.8 pg (ref 26.0–34.0)
MCHC: 32.9 g/dL (ref 30.0–36.0)
MCV: 93.8 fL (ref 80.0–100.0)
Monocytes Absolute: 0.6 10*3/uL (ref 0.1–1.0)
Monocytes Relative: 2 %
Neutro Abs: 2.5 10*3/uL (ref 1.7–7.7)
Neutrophils Relative %: 9 %
Platelets: 313 10*3/uL (ref 150–400)
RBC: 4.8 MIL/uL (ref 3.87–5.11)
RDW: 12.6 % (ref 11.5–15.5)
WBC: 26.6 10*3/uL — ABNORMAL HIGH (ref 4.0–10.5)
nRBC: 0 % (ref 0.0–0.2)

## 2019-01-14 LAB — CMP (CANCER CENTER ONLY)
ALT: 14 U/L (ref 0–44)
AST: 19 U/L (ref 15–41)
Albumin: 4.6 g/dL (ref 3.5–5.0)
Alkaline Phosphatase: 77 U/L (ref 38–126)
Anion gap: 10 (ref 5–15)
BUN: 12 mg/dL (ref 6–20)
CO2: 27 mmol/L (ref 22–32)
Calcium: 9.8 mg/dL (ref 8.9–10.3)
Chloride: 104 mmol/L (ref 98–111)
Creatinine: 0.75 mg/dL (ref 0.44–1.00)
GFR, Est AFR Am: >60 mL/min (ref >60)
GFR, Estimated: >60 mL/min (ref >60)
Glucose, Bld: 94 mg/dL (ref 70–99)
Potassium: 4.3 mmol/L (ref 3.5–5.1)
Sodium: 141 mmol/L (ref 135–145)
Total Bilirubin: 0.4 mg/dL (ref 0.3–1.2)
Total Protein: 7.4 g/dL (ref 6.5–8.1)

## 2019-01-14 LAB — LACTATE DEHYDROGENASE: LDH: 164 U/L (ref 98–192)

## 2019-01-15 ENCOUNTER — Telehealth: Payer: Self-pay | Admitting: Hematology

## 2019-01-15 NOTE — Telephone Encounter (Signed)
Scheduled appt per 6/11 los. A calendar will be mailed out. °

## 2019-02-21 ENCOUNTER — Telehealth: Payer: Commercial Managed Care - PPO | Admitting: Nurse Practitioner

## 2019-02-21 DIAGNOSIS — Z20822 Contact with and (suspected) exposure to covid-19: Secondary | ICD-10-CM

## 2019-02-21 DIAGNOSIS — C9110 Chronic lymphocytic leukemia of B-cell type not having achieved remission: Secondary | ICD-10-CM

## 2019-02-21 NOTE — Addendum Note (Signed)
Addended by: Chevis Pretty on: 02/21/2019 03:40 PM   Modules accepted: Orders

## 2019-02-21 NOTE — Progress Notes (Addendum)
E-Visit for Corona Virus Screening   Your current symptoms could be consistent with the coronavirus.  Many health care providers can now test patients at their office but not all are.  [facility] has multiple testing sites. For information on our COVID testing locations and hours go to [email or web address]  Please quarantine yourself while awaiting your test results.  * I do not suggest that you go to the ER for testing because of possible exposure in case you do not have it. With your leukemia you are very susceptible to catching it by going any where. COVID-19 is a respiratory illness with symptoms that are similar to the flu. Symptoms are typically mild to moderate, but there have been cases of severe illness and death due to the virus. The following symptoms may appear 2-14 days after exposure: . Fever . Cough . Shortness of breath or difficulty breathing . Chills . Repeated shaking with chills . Muscle pain . Headache . Sore throat . New loss of taste or smell . Fatigue . Congestion or runny nose . Nausea or vomiting . Diarrhea  It is vitally important that if you feel that you have an infection such as this virus or any other virus that you stay home and away from places where you may spread it to others.  You should self-quarantine for 14 days if you have symptoms that could potentially be coronavirus or have been in close contact a with a person diagnosed with COVID-19 within the last 2 weeks. You should avoid contact with people age 60 and older.   You should wear a mask or cloth face covering over your nose and mouth if you must be around other people or animals, including pets (even at home). Try to stay at least 6 feet away from other people. This will protect the people around you.  You can use medication such as delsym if develop a cough.  You may also take acetaminophen (Tylenol) as needed for fever.   Reduce your risk of any infection by using  the same precautions used for avoiding the common cold or flu:  Marland Kitchen Wash your hands often with soap and warm water for at least 20 seconds.  If soap and water are not readily available, use an alcohol-based hand sanitizer with at least 60% alcohol.  . If coughing or sneezing, cover your mouth and nose by coughing or sneezing into the elbow areas of your shirt or coat, into a tissue or into your sleeve (not your hands). . Avoid shaking hands with others and consider head nods or verbal greetings only. . Avoid touching your eyes, nose, or mouth with unwashed hands.  . Avoid close contact with people who are sick. . Avoid places or events with large numbers of people in one location, like concerts or sporting events. . Carefully consider travel plans you have or are making. . If you are planning any travel outside or inside the [name], visit the CDC's Travelers' Health webpage for the latest health notices. . If you have some symptoms but not all symptoms, continue to monitor at home and seek medical attention if your symptoms worsen. . If you are having a medical emergency, call 911.  HOME CARE . Only take medications as instructed by your medical team. . Drink plenty of fluids and get plenty of rest. . A steam or ultrasonic humidifier can help if you have congestion.   GET HELP RIGHT AWAY IF YOU HAVE EMERGENCY WARNING SIGNS** FOR COVID-19. If you  or someone is showing any of these signs seek emergency medical care immediately. Call 911 or proceed to your closest emergency facility if: . You develop worsening high fever. . Trouble breathing . Bluish lips or face . Persistent pain or pressure in the chest . New confusion . Inability to wake or stay awake . You cough up blood. . Your symptoms become more severe  **This list is not all possible symptoms. Contact your medical provider for any symptoms that are sever or concerning to you.   MAKE SURE YOU   Understand these instructions.  Will  watch your condition.  Will get help right away if you are not doing well or get worse.  Your e-visit answers were reviewed by a board certified advanced clinical practitioner to complete your personal care plan.  Depending on the condition, your plan could have included both over the counter or prescription medications.  If there is a problem please reply once you have received a response from your provider.  Your safety is important to [name].  If you have drug allergies check your prescription carefully.    You can use MyChart to ask questions about today's visit, request a non-urgent call back, or ask for a work or school excuse for 24 hours related to this e-Visit. If it has been greater than 24 hours you will need to follow up with your provider, or enter a new e-Visit to address those concerns. You will get an e-mail in the next two days asking about your experience.  I hope that your e-visit has been valuable and will speed your recovery. Thank you for using e-visits.  5-10 minutes spent reviewing and documenting in chart.

## 2019-02-22 ENCOUNTER — Other Ambulatory Visit: Payer: Self-pay

## 2019-02-22 ENCOUNTER — Telehealth: Payer: Self-pay | Admitting: *Deleted

## 2019-02-22 ENCOUNTER — Encounter: Payer: Self-pay | Admitting: Nurse Practitioner

## 2019-02-22 DIAGNOSIS — Z20822 Contact with and (suspected) exposure to covid-19: Secondary | ICD-10-CM

## 2019-02-22 NOTE — Telephone Encounter (Addendum)
Patient wanted Dr. Irene Limbo to know she was exposed to [place] over weekend. She contacted her PCP and was sent to be tested at [facility] drive through. She did not get a "quick test", so should have results in 5 days. PCP told her to notifiy you. Patient wants to know if she should be doing anything else since she has CLL. Information given to Dr.Kale.

## 2019-02-22 NOTE — Telephone Encounter (Signed)
Per Dr. Irene Limbo, contact patient with following: self quarantine for 14 days and seek immediate attention in hospital for any evolving symptoms. Attempted to contact patient with this information. Left information on named voice mail and encouraged to contact office if further questions.

## 2019-02-25 LAB — NOVEL CORONAVIRUS, NAA: SARS-CoV-2, NAA: NOT DETECTED

## 2019-07-16 ENCOUNTER — Inpatient Hospital Stay: Payer: Commercial Managed Care - PPO | Attending: Hematology

## 2019-07-16 ENCOUNTER — Other Ambulatory Visit: Payer: Self-pay

## 2019-07-16 ENCOUNTER — Inpatient Hospital Stay (HOSPITAL_BASED_OUTPATIENT_CLINIC_OR_DEPARTMENT_OTHER): Payer: Commercial Managed Care - PPO | Admitting: Hematology

## 2019-07-16 VITALS — BP 136/82 | HR 63 | Temp 97.8°F | Resp 18 | Ht 68.5 in | Wt 136.7 lb

## 2019-07-16 DIAGNOSIS — C9110 Chronic lymphocytic leukemia of B-cell type not having achieved remission: Secondary | ICD-10-CM | POA: Diagnosis not present

## 2019-07-16 LAB — CMP (CANCER CENTER ONLY)
ALT: 12 U/L (ref 0–44)
AST: 18 U/L (ref 15–41)
Albumin: 4.7 g/dL (ref 3.5–5.0)
Alkaline Phosphatase: 64 U/L (ref 38–126)
Anion gap: 9 (ref 5–15)
BUN: 8 mg/dL (ref 6–20)
CO2: 28 mmol/L (ref 22–32)
Calcium: 9.4 mg/dL (ref 8.9–10.3)
Chloride: 105 mmol/L (ref 98–111)
Creatinine: 0.72 mg/dL (ref 0.44–1.00)
GFR, Est AFR Am: >60 mL/min (ref >60)
GFR, Estimated: >60 mL/min (ref >60)
Glucose, Bld: 112 mg/dL — ABNORMAL HIGH (ref 70–99)
Potassium: 4.6 mmol/L (ref 3.5–5.1)
Sodium: 142 mmol/L (ref 135–145)
Total Bilirubin: 0.6 mg/dL (ref 0.3–1.2)
Total Protein: 7.3 g/dL (ref 6.5–8.1)

## 2019-07-16 LAB — CBC WITH DIFFERENTIAL/PLATELET
Abs Immature Granulocytes: 0.03 10*3/uL (ref 0.00–0.07)
Basophils Absolute: 0.1 10*3/uL (ref 0.0–0.1)
Basophils Relative: 0 %
Eosinophils Absolute: 0.1 10*3/uL (ref 0.0–0.5)
Eosinophils Relative: 0 %
HCT: 42.9 % (ref 36.0–46.0)
Hemoglobin: 14.4 g/dL (ref 12.0–15.0)
Immature Granulocytes: 0 %
Lymphocytes Relative: 89 %
Lymphs Abs: 25.8 10*3/uL — ABNORMAL HIGH (ref 0.7–4.0)
MCH: 30.8 pg (ref 26.0–34.0)
MCHC: 33.6 g/dL (ref 30.0–36.0)
MCV: 91.9 fL (ref 80.0–100.0)
Monocytes Absolute: 0.7 10*3/uL (ref 0.1–1.0)
Monocytes Relative: 2 %
Neutro Abs: 2.7 10*3/uL (ref 1.7–7.7)
Neutrophils Relative %: 9 %
Platelets: 306 10*3/uL (ref 150–400)
RBC: 4.67 MIL/uL (ref 3.87–5.11)
RDW: 12.2 % (ref 11.5–15.5)
WBC: 29.4 10*3/uL — ABNORMAL HIGH (ref 4.0–10.5)
nRBC: 0 % (ref 0.0–0.2)

## 2019-07-16 LAB — RETICULOCYTES
Immature Retic Fract: 3.2 % (ref 2.3–15.9)
RBC.: 4.65 MIL/uL (ref 3.87–5.11)
Retic Count, Absolute: 50.7 10*3/uL (ref 19.0–186.0)
Retic Ct Pct: 1.1 % (ref 0.4–3.1)

## 2019-07-16 LAB — LACTATE DEHYDROGENASE: LDH: 155 U/L (ref 98–192)

## 2019-07-16 NOTE — Progress Notes (Signed)
HEMATOLOGY/ONCOLOGY CLINIC NOTE  Date of Service: 07/16/2019  Patient Care Team: [name], MD as PCP - General (Family Medicine)  CHIEF COMPLAINTS/PURPOSE OF CONSULTATION:  F/u of Chronic lymphocytic leukemia   HISTORY OF PRESENTING ILLNESS:   Cheryl Sims is a wonderful 58 y.o. female who has been previously followed by my colleague Dr. Grace Isaac for evaluation and management of Chronic lymphocytic leukemia. She is accompanied today by her husband. The pt reports that she is doing well overall.   The pt was diagnosed with CLL on 11/03/17, Rai stage 0 with an isolated presence of bi-allelic 53G deletion. The pt has been monitored thus far and has not begun any systemic treatments.   The pt reports that she went to the dermatologist last year for "weird" rashes on the back of her neck. She notes that this rash came and went, itched, and went away with antibiotics. She notes that this was believed to be a staph infection or folliculitis.   The pt denies any fatigue that limits her daily activities. She notes that her CLL diagnosis has led to some anxiety about her mortality. She notes that she took Paxel for a few years in her 69s and Welbutrin for a couple years in her 46s. She has a prescription for Xanax which she does not regularly use. The pt and her husband note that they've had unusually stressful events in their family in the last few years. The pt denies much affect to her quality of life or ability to function.   She denies any history of respiratory infections.   Most recent lab results (02/17/18) of CBC w/diff, CMP is as follows: all values are WNL except for WBC at 22.4k, Lymphs abs at 17.7k. LDH 02/17/18 is WNL at 178   On review of systems, pt reports good energy levels, some anxiety, occasional constipation, and denies fevers, chills, night sweats, unexpected weight loss, fatigue, mouth sores, noticing any new lumps or bumps, pain along the spine, abdominal pains,  and any other symptoms.   On PMHx the pt denies respiratory infections or problems. 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  Interval History:   Cheryl Sims returns today for management and evaluation of her CLL.The patient's last visit with [name] was on 01/14/2019. The pt reports that she is doing well overall.  The pt reports she has been stressed due to losing her father in September due to COVID-19 and being back working on campus.  She has noticed general aches in extremities at night.  She is exercising more and notes no acute new concerns.  Lab results today (07/16/19) of CBC w/diff and CMP is as followms: all values are WNL except for WBC at 29.4, Glucose Bld at 112.  On review of systems, pt reports increased stress levels and denies fevers, chills, night sweats, new lumps or bumps, back pain, abdominal pain, rashes, and any other symptoms.    MEDICAL HISTORY:  Past Medical History:  Diagnosis Date  . Anxiety   . H. pylori infection   . IBS (irritable bowel syndrome)   . Peptic ulcer due to Helicobacter pylori     SURGICAL HISTORY: No past surgical history on file.  SOCIAL HISTORY: Social History   Socioeconomic History  . Marital status: Married    Spouse name: Not on file  . Number of children: Not on file  . Years of education: Not on file  . Highest education level: Not on  file  Occupational History  . Not on file  Tobacco Use  . Smoking status: Never Smoker  . Smokeless tobacco: Never Used  Substance and Sexual Activity  . Alcohol use: Yes  . Drug use: Never  . Sexual activity: Yes  Other Topics Concern  . Not on file  Social History Narrative  . Not on file   Social Determinants of Health   Financial Resource Strain:   . Difficulty of Paying Living Expenses: Not on file  Food Insecurity:   . Worried About Charity fundraiser in the Last Year: Not on file  . Ran Out of Food in the Last Year: Not on file  Transportation  Needs:   . Lack of Transportation (Medical): Not on file  . Lack of Transportation (Non-Medical): Not on file  Physical Activity:   . Days of Exercise per Week: Not on file  . Minutes of Exercise per Session: Not on file  Stress:   . Feeling of Stress : Not on file  Social Connections:   . Frequency of Communication with Friends and Family: Not on file  . Frequency of Social Gatherings with Friends and Family: Not on file  . Attends Religious Services: Not on file  . Active Member of Clubs or Organizations: Not on file  . Attends Archivist Meetings: Not on file  . Marital Status: Not on file  Intimate Partner Violence:   . Fear of Current or Ex-Partner: Not on file  . Emotionally Abused: Not on file  . Physically Abused: Not on file  . Sexually Abused: Not on file    FAMILY HISTORY: Family History  Problem Relation Age of Onset  . Alzheimer's disease Father   . Thyroid cancer Brother     ALLERGIES:  has No Known Allergies.  MEDICATIONS:  Current Outpatient Medications  Medication Sig Dispense Refill  . ALPRAZolam (XANAX) 0.5 MG tablet TK 1/2 TO 1 T PO TID PRN  0   No current facility-administered medications for this visit.    REVIEW OF SYSTEMS:   A 10+ POINT REVIEW OF SYSTEMS WAS OBTAINED including neurology, dermatology, psychiatry, cardiac, respiratory, lymph, extremities, GI, GU, Musculoskeletal, constitutional, breasts, reproductive, HEENT.  All pertinent positives are noted in the HPI.  All others are negative.    PHYSICAL EXAMINATION: ECOG FS:1 - Symptomatic but completely ambulatory  Vitals:   07/16/19 1153  BP: 136/82  Pulse: 63  Resp: 18  Temp: 97.8 F (36.6 C)  SpO2: 100%   Wt Readings from Last 3 Encounters:  07/16/19 136 lb 11.2 oz (62 kg)  01/14/19 139 lb 14.4 oz (63.5 kg)  07/15/18 140 lb 9.6 oz (63.8 kg)   Body mass index is 20.48 kg/m.    GENERAL:alert, in no acute distress and comfortable SKIN: no acute rashes, no  significant lesions EYES: conjunctiva are pink and non-injected, sclera anicteric OROPHARYNX: MMM, no exudates, no oropharyngeal erythema or ulceration NECK: supple, no JVD LYMPH:  no palpable lymphadenopathy in the cervical, axillary or inguinal regions LUNGS: clear to auscultation b/l with normal respiratory effort HEART: regular rate & rhythm ABDOMEN:  normoactive bowel sounds , non tender, not distended. Extremity: no pedal edema PSYCH: alert & oriented x 3 with fluent speech NEURO: no focal motor/sensory deficits   LABORATORY DATA:  I have reviewed the data as listed  . CBC Latest Ref Rng & Units 07/16/2019 01/14/2019 07/15/2018  WBC 4.0 - 10.5 K/uL 29.4(H) 26.6(H) 22.9(H)  Hemoglobin 12.0 - 15.0 g/dL 14.4  14.8 13.8  Hematocrit 36.0 - 46.0 % 42.9 45.0 41.5  Platelets 150 - 400 K/uL 306 313 290    CBC    Component Value Date/Time   WBC 29.4 (H) 07/16/2019 1111   RBC 4.65 07/16/2019 1111   RBC 4.67 07/16/2019 1111   HGB 14.4 07/16/2019 1111   HGB 14.5 02/17/2018 0947   HCT 42.9 07/16/2019 1111   PLT 306 07/16/2019 1111   PLT 288 02/17/2018 0947   MCV 91.9 07/16/2019 1111   MCH 30.8 07/16/2019 1111   MCHC 33.6 07/16/2019 1111   RDW 12.2 07/16/2019 1111   LYMPHSABS 25.8 (H) 07/16/2019 1111   MONOABS 0.7 07/16/2019 1111   EOSABS 0.1 07/16/2019 1111   BASOSABS 0.1 07/16/2019 1111    . CMP Latest Ref Rng & Units 07/16/2019 01/14/2019 07/15/2018  Glucose 70 - 99 mg/dL 112(H) 94 99  BUN 6 - 20 mg/dL '8 12 10  '$ Creatinine 0.44 - 1.00 mg/dL 0.72 0.75 0.86  Sodium 135 - 145 mmol/L 142 141 143  Potassium 3.5 - 5.1 mmol/L 4.6 4.3 4.5  Chloride 98 - 111 mmol/L 105 104 106  CO2 22 - 32 mmol/L '28 27 29  '$ Calcium 8.9 - 10.3 mg/dL 9.4 9.8 9.6  Total Protein 6.5 - 8.1 g/dL 7.3 7.4 6.9  Total Bilirubin 0.3 - 1.2 mg/dL 0.6 0.4 0.7  Alkaline Phos 38 - 126 U/L 64 77 65  AST 15 - 41 U/L '18 19 19  '$ ALT 0 - 44 U/L '12 14 13    '$ 11/03/17 Peripheral Blood Flow Cytometry:   11/03/17 FISH,  CLL Prognostic Panel:   RADIOGRAPHIC STUDIES: I have personally reviewed the radiological images as listed and agreed with the findings in the report. No results found.  ASSESSMENT & PLAN:  58 y.o. female with  1. Chronic lymphocytic leukemia Rai stage 0 11/03/17 Peripheral blood flow cytometry positive for monoclonal lymphoid population comprising 83% of lymphoid events, consistent with CLL.  11/03/17 CLL FISH panel negative for trisomy 12, p53 deletion or amplification, and ATM deletion. Positive for [id number] (31% with bi-allelic deletion) 12/20/59 CT N/C/A/P indicated increased number without pathological enlargement of LN in the neck, no pathological lymphadenopathy in the chest, abdomen of pelvis. No splenomegaly.   PLAN: -Discussed pt labwork today, 07/16/19; all values are WNL except for WBC at 29.4, Glucose Bld at 112. -Discussed 07/16/19 WBC at 29.4 -Discussed 07/16/19 Glucose Bld at 112 -Discussed 07/16/19 hemoglobin at 14.4 -Discussed 07/16/19 platelets at 306, normal -Discussed 07/16/19 weight is stable -Advised that there are no major changes in blood chemistries and blood counts  -no evidence of symptomatic progression of patients CLL at this time.  -No indication for treatment of patient's CLL at this time FOLLOW UP: -RTC with Dr Irene Limbo with labs in 6 months  The total time spent in the appt was 20 minutes and more than 50% was on counseling and direct patient cares.  All of the patient's questions were answered with apparent satisfaction. The patient knows to call the clinic with any problems, questions or concerns.    Sullivan Lone MD MS AAHIVMS [facility] Hematology/Oncology Physician [facility]  (Office):       [phone] (Work cell):  [phone] (Fax):           [phone]  07/16/2019 6:40 AM  I, Scot Dock, am acting as a scribe for Dr. Sullivan Lone.   .I have reviewed the above documentation for accuracy and completeness, and I agree with  the above. Brunetta Genera MD

## 2019-07-19 ENCOUNTER — Telehealth: Payer: Self-pay | Admitting: Hematology

## 2019-07-19 NOTE — Telephone Encounter (Signed)
Scheduled appt per 12/11 los.  Spoke with pt and they are aware of the appt date and time. 

## 2019-10-12 ENCOUNTER — Encounter: Payer: Self-pay | Admitting: Hematology

## 2019-10-14 ENCOUNTER — Encounter: Payer: Self-pay | Admitting: *Deleted

## 2020-01-13 NOTE — Progress Notes (Signed)
HEMATOLOGY/ONCOLOGY CLINIC NOTE  Date of Service: 01/14/2020  Patient Care Team: [name], MD as PCP - General (Family Medicine)  CHIEF COMPLAINTS/PURPOSE OF CONSULTATION:  F/u of Chronic lymphocytic leukemia   HISTORY OF PRESENTING ILLNESS:   Cheryl Sims is a wonderful 59 y.o. female who has been previously followed by my colleague Dr. Grace Isaac for evaluation and management of Chronic lymphocytic leukemia. She is accompanied today by her husband. The pt reports that she is doing well overall.   The pt was diagnosed with CLL on 11/03/17, Rai stage 0 with an isolated presence of bi-allelic 78M deletion. The pt has been monitored thus far and has not begun any systemic treatments.   The pt reports that she went to the dermatologist last year for "weird" rashes on the back of her neck. She notes that this rash came and went, itched, and went away with antibiotics. She notes that this was believed to be a staph infection or folliculitis.   The pt denies any fatigue that limits her daily activities. She notes that her CLL diagnosis has led to some anxiety about her mortality. She notes that she took Paxel for a few years in her 63s and Welbutrin for a couple years in her 65s. She has a prescription for Xanax which she does not regularly use. The pt and her husband note that they've had unusually stressful events in their family in the last few years. The pt denies much affect to her quality of life or ability to function.   She denies any history of respiratory infections.   Most recent lab results (02/17/18) of CBC w/diff, CMP is as follows: all values are WNL except for WBC at 22.4k, Lymphs abs at 17.7k. LDH 02/17/18 is WNL at 178   On review of systems, pt reports good energy levels, some anxiety, occasional constipation, and denies fevers, chills, night sweats, unexpected weight loss, fatigue, mouth sores, noticing any new lumps or bumps, pain along the spine, abdominal pains,  and any other symptoms.   On PMHx the pt denies respiratory infections or problems. 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  Interval History:   Cheryl Sims returns today for management and evaluation of her CLL. The patient's last visit with [name] was on 07/16/2019. The pt reports that she is doing well overall.  The pt reports she is good. She reports things have been steady with no new concerns. Pt has been running more and her energy is less when she exerts herself.   Lab results today (01/14/20) of CBC w/diff and CMP is as follows: all values are WNL except for WBC at 31.4K, Lymph Abs at 28.0K 01/14/20 of LDH at 158: WNL  On review of systems, pt reports staying active and denies back pain and any other symptoms.    MEDICAL HISTORY:  Past Medical History:  Diagnosis Date   Anxiety    H. pylori infection    IBS (irritable bowel syndrome)    Peptic ulcer due to Helicobacter pylori     SURGICAL HISTORY: No past surgical history on file.  SOCIAL HISTORY: Social History   Socioeconomic History   Marital status: Married    Spouse name: Not on file   Number of children: Not on file   Years of education: Not on file   Highest education level: Not on file  Occupational History   Not on file  Tobacco Use   Smoking status: Never  Smokeless tobacco: Never Used  °Vaping Use  °• Vaping Use: Never used  °Substance and Sexual Activity  °• Alcohol use: Yes  °• Drug use: Never  °• Sexual activity: Yes  °Other Topics Concern  °• Not on file  °Social History Narrative  °• Not on file  ° °Social Determinants of Health  ° °Financial Resource Strain:   °• Difficulty of Paying Living Expenses:   °Food Insecurity:   °• Worried About Running Out of Food in the Last Year:   °• Ran Out of Food in the Last Year:   °Transportation Needs:   °• Lack of Transportation (Medical):   °• Lack of Transportation (Non-Medical):   °Physical Activity:   °• Days of Exercise  per Week:   °• Minutes of Exercise per Session:   °Stress:   °• Feeling of Stress :   °Social Connections:   °• Frequency of Communication with Friends and Family:   °• Frequency of Social Gatherings with Friends and Family:   °• Attends Religious Services:   °• Active Member of Clubs or Organizations:   °• Attends Club or Organization Meetings:   °• Marital Status:   °Intimate Partner Violence:   °• Fear of Current or Ex-Partner:   °• Emotionally Abused:   °• Physically Abused:   °• Sexually Abused:   ° ° °FAMILY HISTORY: °Family History  °Problem Relation Age of Onset  °• Alzheimer's disease Father   °• Thyroid cancer Brother   ° ° °ALLERGIES:  has No Known Allergies. ° °MEDICATIONS:  °Current Outpatient Medications  °Medication Sig Dispense Refill  °• ALPRAZolam (XANAX) 0.5 MG tablet TK 1/2 TO 1 T PO TID PRN  0  ° °No current facility-administered medications for this visit.  ° ° °REVIEW OF SYSTEMS:   °A 10+ POINT REVIEW OF SYSTEMS WAS OBTAINED including neurology, dermatology, psychiatry, cardiac, respiratory, lymph, extremities, GI, GU, Musculoskeletal, constitutional, breasts, reproductive, HEENT.  All pertinent positives are noted in the HPI.  All others are negative.  ° °PHYSICAL EXAMINATION: °ECOG FS:1 - Symptomatic but completely ambulatory ° °Vitals:  ° 01/14/20 1048  °BP: 118/83  °Pulse: 63  °Resp: 16  °Temp: 97.7 °F (36.5 °C)  °SpO2: 100%  ° °Wt Readings from Last 3 Encounters:  °01/14/20 135 lb 1.6 oz (61.3 kg)  °07/16/19 136 lb 11.2 oz (62 kg)  °01/14/19 139 lb 14.4 oz (63.5 kg)  ° °Body mass index is 19.95 kg/m².   ° °GENERAL:alert, in no acute distress and comfortable °SKIN: no acute rashes, no significant lesions °EYES: conjunctiva are pink and non-injected, sclera anicteric °OROPHARYNX: MMM, no exudates, no oropharyngeal erythema or ulceration °NECK: supple, no JVD °LYMPH:  no palpable lymphadenopathy in the cervical, axillary or inguinal regions °LUNGS: clear to auscultation b/l with normal  respiratory effort °HEART: regular rate & rhythm °ABDOMEN:  normoactive bowel sounds , non tender, not distended. °Extremity: no pedal edema °PSYCH: alert & oriented x 3 with fluent speech °NEURO: no focal motor/sensory deficits ° °LABORATORY DATA:  °I have reviewed the data as listed ° °. °CBC Latest Ref Rng & Units 01/14/2020 07/16/2019 01/14/2019  °WBC 4.0 - 10.5 K/uL 31.4(H) 29.4(H) 26.6(H)  °Hemoglobin 12.0 - 15.0 g/dL 14.3 14.4 14.8  °Hematocrit 36 - 46 % 43.7 42.9 45.0  °Platelets 150 - 400 K/uL 299 306 313  ° ° °CBC °   °Component Value Date/Time  ° WBC 31.4 (H) 01/14/2020 1000  ° RBC 4.67 01/14/2020 1000  ° HGB 14.3 01/14/2020 1000  °   HGB 14.5 02/17/2018 0947  ° HCT 43.7 01/14/2020 1000  ° PLT 299 01/14/2020 1000  ° PLT 288 02/17/2018 0947  ° MCV 93.6 01/14/2020 1000  ° MCH 30.6 01/14/2020 1000  ° MCHC 32.7 01/14/2020 1000  ° RDW 12.5 01/14/2020 1000  ° LYMPHSABS 28.0 (H) 01/14/2020 1000  ° MONOABS 0.6 01/14/2020 1000  ° EOSABS 0.1 01/14/2020 1000  ° BASOSABS 0.1 01/14/2020 1000  ° ° °. °CMP Latest Ref Rng & Units 01/14/2020 07/16/2019 01/14/2019  °Glucose 70 - 99 mg/dL 89 112(H) 94  °BUN 6 - 20 mg/dL 9 8 12  °Creatinine 0.44 - 1.00 mg/dL 0.72 0.72 0.75  °Sodium 135 - 145 mmol/L 142 142 141  °Potassium 3.5 - 5.1 mmol/L 4.1 4.6 4.3  °Chloride 98 - 111 mmol/L 106 105 104  °CO2 22 - 32 mmol/L 26 28 27  °Calcium 8.9 - 10.3 mg/dL 9.5 9.4 9.8  °Total Protein 6.5 - 8.1 g/dL 7.0 7.3 7.4  °Total Bilirubin 0.3 - 1.2 mg/dL 0.8 0.6 0.4  °Alkaline Phos 38 - 126 U/L 68 64 77  °AST 15 - 41 U/L 19 18 19  °ALT 0 - 44 U/L 15 12 14  ° ° °11/03/17 Peripheral Blood Flow Cytometry: ° ° °11/03/17 FISH, CLL Prognostic Panel: ° ° °RADIOGRAPHIC STUDIES: °I have personally reviewed the radiological images as listed and agreed with the findings in the report. °No results found. ° °ASSESSMENT & PLAN:  °59 y.o. female with ° °1. Chronic lymphocytic leukemia °Rai stage 0 °11/03/17 Peripheral blood flow cytometry positive for monoclonal lymphoid  population comprising 83% of lymphoid events, consistent with CLL.  °11/03/17 CLL FISH panel negative for trisomy 12, p53 deletion or amplification, and ATM deletion. Positive for [id number] (36% with bi-allelic deletion) °11/13/17 CT N/C/A/P indicated increased number without pathological enlargement of LN in the neck, no pathological lymphadenopathy in the chest, abdomen of pelvis. No splenomegaly.  ° °PLAN: °-Discussed pt labwork today, 01/14/20;  of CBC w/diff and CMP is as follows: all values are WNL except for WBC at 31.4K, Lymph Abs at 28.0K °-Discussed 01/14/20 of LDH at 158: WNL °-Advised blood counts and blood chemistries are stable  °-no evidence of symptomatic progression of patients CLL at this time.  °-No indication for treatment of patient's CLL at this  °-Recommends pneumonia shots (Prevnar and pneumovax)  °-Recommends shingrix vaccine  °-patient notes she will f/u with PCP regarding vaccinations. °-Will see back in 6 months  ° °FOLLOW UP: °RTC with Dr Kale with labs in 6 months ° °The total time spent in the appt was 20 minutes and more than 50% was on counseling and direct patient cares. ° °All of the patient's questions were answered with apparent satisfaction. The patient knows to call the clinic with any problems, questions or concerns. ° °Gautam Kale MD MS AAHIVMS [facility] °Hematology/Oncology Physician °[facility] ° °(Office):       [phone] °(Work cell):  [phone] °(Fax):           [phone] ° °01/14/2020 11:37 AM ° °I, Elaine Hinkel am acting as a scribe for Dr. Gautam Kale.  ° °.I have reviewed the above documentation for accuracy and completeness, and I agree with the above. °.Gautam Kishore Kale MD °  °  °

## 2020-01-14 ENCOUNTER — Inpatient Hospital Stay: Payer: Commercial Managed Care - PPO | Attending: Hematology

## 2020-01-14 ENCOUNTER — Inpatient Hospital Stay (HOSPITAL_BASED_OUTPATIENT_CLINIC_OR_DEPARTMENT_OTHER): Payer: Commercial Managed Care - PPO | Admitting: Hematology

## 2020-01-14 ENCOUNTER — Other Ambulatory Visit: Payer: Self-pay

## 2020-01-14 VITALS — BP 118/83 | HR 63 | Temp 97.7°F | Resp 16 | Ht 69.0 in | Wt 135.1 lb

## 2020-01-14 DIAGNOSIS — C9110 Chronic lymphocytic leukemia of B-cell type not having achieved remission: Secondary | ICD-10-CM

## 2020-01-14 LAB — CMP (CANCER CENTER ONLY)
ALT: 15 U/L (ref 0–44)
AST: 19 U/L (ref 15–41)
Albumin: 4.5 g/dL (ref 3.5–5.0)
Alkaline Phosphatase: 68 U/L (ref 38–126)
Anion gap: 10 (ref 5–15)
BUN: 9 mg/dL (ref 6–20)
CO2: 26 mmol/L (ref 22–32)
Calcium: 9.5 mg/dL (ref 8.9–10.3)
Chloride: 106 mmol/L (ref 98–111)
Creatinine: 0.72 mg/dL (ref 0.44–1.00)
GFR, Est AFR Am: >60 mL/min (ref >60)
GFR, Estimated: >60 mL/min (ref >60)
Glucose, Bld: 89 mg/dL (ref 70–99)
Potassium: 4.1 mmol/L (ref 3.5–5.1)
Sodium: 142 mmol/L (ref 135–145)
Total Bilirubin: 0.8 mg/dL (ref 0.3–1.2)
Total Protein: 7 g/dL (ref 6.5–8.1)

## 2020-01-14 LAB — CBC WITH DIFFERENTIAL/PLATELET
Abs Immature Granulocytes: 0.02 10*3/uL (ref 0.00–0.07)
Basophils Absolute: 0.1 10*3/uL (ref 0.0–0.1)
Basophils Relative: 0 %
Eosinophils Absolute: 0.1 10*3/uL (ref 0.0–0.5)
Eosinophils Relative: 0 %
HCT: 43.7 % (ref 36.0–46.0)
Hemoglobin: 14.3 g/dL (ref 12.0–15.0)
Immature Granulocytes: 0 %
Lymphocytes Relative: 90 %
Lymphs Abs: 28 10*3/uL — ABNORMAL HIGH (ref 0.7–4.0)
MCH: 30.6 pg (ref 26.0–34.0)
MCHC: 32.7 g/dL (ref 30.0–36.0)
MCV: 93.6 fL (ref 80.0–100.0)
Monocytes Absolute: 0.6 10*3/uL (ref 0.1–1.0)
Monocytes Relative: 2 %
Neutro Abs: 2.5 10*3/uL (ref 1.7–7.7)
Neutrophils Relative %: 8 %
Platelets: 299 10*3/uL (ref 150–400)
RBC: 4.67 MIL/uL (ref 3.87–5.11)
RDW: 12.5 % (ref 11.5–15.5)
WBC: 31.4 10*3/uL — ABNORMAL HIGH (ref 4.0–10.5)
nRBC: 0 % (ref 0.0–0.2)

## 2020-01-14 LAB — LACTATE DEHYDROGENASE: LDH: 158 U/L (ref 98–192)

## 2020-01-17 ENCOUNTER — Telehealth: Payer: Self-pay | Admitting: Hematology

## 2020-01-17 NOTE — Telephone Encounter (Signed)
Scheduled per 06/11 los, patient has been called and notified.

## 2020-02-17 ENCOUNTER — Telehealth: Payer: Self-pay | Admitting: *Deleted

## 2020-02-17 NOTE — Telephone Encounter (Signed)
Contacted by Dr.Wolters office to inquire if ok for patient to have shingles series. Per Dr Irene Limbo - it is ok Called number provided [phone] - Dr. Myer Peer nurse and lLVM with this information

## 2020-07-13 ENCOUNTER — Telehealth: Payer: Self-pay | Admitting: Hematology

## 2020-07-13 NOTE — Telephone Encounter (Signed)
R/s 12/10 appt per patient request to 1/4. Confirmed new date and time with patient

## 2020-07-14 ENCOUNTER — Inpatient Hospital Stay: Payer: Commercial Managed Care - PPO

## 2020-07-14 ENCOUNTER — Inpatient Hospital Stay: Payer: Commercial Managed Care - PPO | Admitting: Hematology

## 2020-08-08 ENCOUNTER — Inpatient Hospital Stay (HOSPITAL_BASED_OUTPATIENT_CLINIC_OR_DEPARTMENT_OTHER): Payer: Commercial Managed Care - PPO | Admitting: Hematology

## 2020-08-08 ENCOUNTER — Inpatient Hospital Stay: Payer: Commercial Managed Care - PPO | Attending: Hematology

## 2020-08-08 ENCOUNTER — Other Ambulatory Visit: Payer: Self-pay

## 2020-08-08 VITALS — BP 133/78 | HR 62 | Temp 97.7°F | Resp 14 | Wt 133.0 lb

## 2020-08-08 DIAGNOSIS — C9110 Chronic lymphocytic leukemia of B-cell type not having achieved remission: Secondary | ICD-10-CM | POA: Insufficient documentation

## 2020-08-08 LAB — CBC WITH DIFFERENTIAL/PLATELET
Abs Immature Granulocytes: 0.03 10*3/uL (ref 0.00–0.07)
Basophils Absolute: 0.1 10*3/uL (ref 0.0–0.1)
Basophils Relative: 0 %
Eosinophils Absolute: 0.2 10*3/uL (ref 0.0–0.5)
Eosinophils Relative: 1 %
HCT: 44.8 % (ref 36.0–46.0)
Hemoglobin: 14.7 g/dL (ref 12.0–15.0)
Immature Granulocytes: 0 %
Lymphocytes Relative: 84 %
Lymphs Abs: 22.3 10*3/uL — ABNORMAL HIGH (ref 0.7–4.0)
MCH: 30.8 pg (ref 26.0–34.0)
MCHC: 32.8 g/dL (ref 30.0–36.0)
MCV: 93.9 fL (ref 80.0–100.0)
Monocytes Absolute: 0.7 10*3/uL (ref 0.1–1.0)
Monocytes Relative: 3 %
Neutro Abs: 3.2 10*3/uL (ref 1.7–7.7)
Neutrophils Relative %: 12 %
Platelets: 276 10*3/uL (ref 150–400)
RBC: 4.77 MIL/uL (ref 3.87–5.11)
RDW: 13 % (ref 11.5–15.5)
WBC: 26.5 10*3/uL — ABNORMAL HIGH (ref 4.0–10.5)
nRBC: 0 % (ref 0.0–0.2)

## 2020-08-08 LAB — CMP (CANCER CENTER ONLY)
ALT: 22 U/L (ref 0–44)
AST: 22 U/L (ref 15–41)
Albumin: 4.5 g/dL (ref 3.5–5.0)
Alkaline Phosphatase: 73 U/L (ref 38–126)
Anion gap: 7 (ref 5–15)
BUN: 8 mg/dL (ref 6–20)
CO2: 26 mmol/L (ref 22–32)
Calcium: 9.3 mg/dL (ref 8.9–10.3)
Chloride: 106 mmol/L (ref 98–111)
Creatinine: 0.75 mg/dL (ref 0.44–1.00)
GFR, Estimated: >60 mL/min (ref >60)
Glucose, Bld: 83 mg/dL (ref 70–99)
Potassium: 4.1 mmol/L (ref 3.5–5.1)
Sodium: 139 mmol/L (ref 135–145)
Total Bilirubin: 0.6 mg/dL (ref 0.3–1.2)
Total Protein: 7.3 g/dL (ref 6.5–8.1)

## 2020-08-08 LAB — LACTATE DEHYDROGENASE: LDH: 185 U/L (ref 98–192)

## 2020-08-08 NOTE — Progress Notes (Signed)
HEMATOLOGY/ONCOLOGY CLINIC NOTE  Date of Service: 08/08/2020  Patient Care Team: [name], MD as PCP - General (Family Medicine)  CHIEF COMPLAINTS/PURPOSE OF CONSULTATION:  F/u of Chronic lymphocytic leukemia   HISTORY OF PRESENTING ILLNESS:   Cheryl Sims is a wonderful 60 y.o. female who has been previously followed by my colleague Dr. Grace Isaac for evaluation and management of Chronic lymphocytic leukemia. She is accompanied today by her husband. The pt reports that she is doing well overall.   The pt was diagnosed with CLL on 11/03/17, Rai stage 0 with an isolated presence of bi-allelic 83M deletion. The pt has been monitored thus far and has not begun any systemic treatments.   The pt reports that she went to the dermatologist last year for "weird" rashes on the back of her neck. She notes that this rash came and went, itched, and went away with antibiotics. She notes that this was believed to be a staph infection or folliculitis.   The pt denies any fatigue that limits her daily activities. She notes that her CLL diagnosis has led to some anxiety about her mortality. She notes that she took Paxel for a few years in her 23s and Welbutrin for a couple years in her 66s. She has a prescription for Xanax which she does not regularly use. The pt and her husband note that they've had unusually stressful events in their family in the last few years. The pt denies much affect to her quality of life or ability to function.   She denies any history of respiratory infections.   Most recent lab results (02/17/18) of CBC w/diff, CMP is as follows: all values are WNL except for WBC at 22.4k, Lymphs abs at 17.7k. LDH 02/17/18 is WNL at 178   On review of systems, pt reports good energy levels, some anxiety, occasional constipation, and denies fevers, chills, night sweats, unexpected weight loss, fatigue, mouth sores, noticing any new lumps or bumps, pain along the spine, abdominal pains,  and any other symptoms.   On PMHx the pt denies respiratory infections or problems. 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  Interval History:  Cheryl Sims returns today for management and evaluation of her CLL. The patient's last visit with [name] was on 01/14/2020. The pt reports that she is doing well overall.  The pt reports that she has recently experienced some close personal losses. There was concern for a COVID19 infection in her home but everyone tested negative. She has received her COVID19 booster & annual flu vaccine. She is currently taking 200 mg EGCG, 1600 mg Fish Oil, & 10 mg Lexapro daily. Pt feels that Lexapro is helping to prevent her being startled when she wakes. The pt is experiencing sinus symptoms for which she is using a sterile saline spray.   Lab results today (08/08/20) of CBC w/diff and CMP is as follows: all values are WNL except for WBC at 26.5K, Lymphs Abs at 22.3K. 08/08/2020 LDH at 185  On review of systems, pt denies fevers, chills, night sweats, new lumps/bumps, leg swelling, abdominal pain and any other symptoms.    MEDICAL HISTORY:  Past Medical History:  Diagnosis Date  . Anxiety   . H. pylori infection   . IBS (irritable bowel syndrome)   . Peptic ulcer due to Helicobacter pylori     SURGICAL HISTORY: No past surgical history on file.  SOCIAL HISTORY: Social History   Socioeconomic History  .  Marital status: Married    Spouse name: Not on file  . Number of children: Not on file  . Years of education: Not on file  . Highest education level: Not on file  Occupational History  . Not on file  Tobacco Use  . Smoking status: Never Smoker  . Smokeless tobacco: Never Used  Vaping Use  . Vaping Use: Never used  Substance and Sexual Activity  . Alcohol use: Yes  . Drug use: Never  . Sexual activity: Yes  Other Topics Concern  . Not on file  Social History Narrative  . Not on file   Social Determinants of Health    Financial Resource Strain: Not on file  Food Insecurity: Not on file  Transportation Needs: Not on file  Physical Activity: Not on file  Stress: Not on file  Social Connections: Not on file  Intimate Partner Violence: Not on file    FAMILY HISTORY: Family History  Problem Relation Age of Onset  . Alzheimer's disease Father   . Thyroid cancer Brother     ALLERGIES:  has No Known Allergies.  MEDICATIONS:  Current Outpatient Medications  Medication Sig Dispense Refill  . ALPRAZolam (XANAX) 0.5 MG tablet TK 1/2 TO 1 T PO TID PRN  0   No current facility-administered medications for this visit.    REVIEW OF SYSTEMS:   A 10+ POINT REVIEW OF SYSTEMS WAS OBTAINED including neurology, dermatology, psychiatry, cardiac, respiratory, lymph, extremities, GI, GU, Musculoskeletal, constitutional, breasts, reproductive, HEENT.  All pertinent positives are noted in the HPI.  All others are negative.   PHYSICAL EXAMINATION: ECOG FS:1 - Symptomatic but completely ambulatory  Vitals:   08/08/20 1324  BP: 133/78  Pulse: 62  Resp: 14  Temp: 97.7 F (36.5 C)  SpO2: 100%   Wt Readings from Last 3 Encounters:  08/08/20 133 lb (60.3 kg)  01/14/20 135 lb 1.6 oz (61.3 kg)  07/16/19 136 lb 11.2 oz (62 kg)   Body mass index is 19.64 kg/m.    GENERAL:alert, in no acute distress and comfortable SKIN: no acute rashes, no significant lesions EYES: conjunctiva are pink and non-injected, sclera anicteric OROPHARYNX: MMM, no exudates, no oropharyngeal erythema or ulceration NECK: supple, no JVD LYMPH:  no palpable lymphadenopathy in the cervical, axillary or inguinal regions LUNGS: clear to auscultation b/l with normal respiratory effort HEART: regular rate & rhythm ABDOMEN:  normoactive bowel sounds , non tender, not distended. No palpable hepatosplenomegaly.  Extremity: no pedal edema PSYCH: alert & oriented x 3 with fluent speech NEURO: no focal motor/sensory deficits  LABORATORY  DATA:  I have reviewed the data as listed  . CBC Latest Ref Rng & Units 08/08/2020 01/14/2020 07/16/2019  WBC 4.0 - 10.5 K/uL 26.5(H) 31.4(H) 29.4(H)  Hemoglobin 12.0 - 15.0 g/dL 14.7 14.3 14.4  Hematocrit 36.0 - 46.0 % 44.8 43.7 42.9  Platelets 150 - 400 K/uL 276 299 306    CBC    Component Value Date/Time   WBC 26.5 (H) 08/08/2020 1304   RBC 4.77 08/08/2020 1304   HGB 14.7 08/08/2020 1304   HGB 14.5 02/17/2018 0947   HCT 44.8 08/08/2020 1304   PLT 276 08/08/2020 1304   PLT 288 02/17/2018 0947   MCV 93.9 08/08/2020 1304   MCH 30.8 08/08/2020 1304   MCHC 32.8 08/08/2020 1304   RDW 13.0 08/08/2020 1304   LYMPHSABS 22.3 (H) 08/08/2020 1304   MONOABS 0.7 08/08/2020 1304   EOSABS 0.2 08/08/2020 1304   BASOSABS 0.1  08/08/2020 1304    . CMP Latest Ref Rng & Units 08/08/2020 01/14/2020 07/16/2019  Glucose 70 - 99 mg/dL 83 89 112(H)  BUN 6 - 20 mg/dL _0 Creatinine 0.44 - 1.00 mg/dL 0.75 0.72 0.72  Sodium 135 - 145 mmol/L 139 142 142  Potassium 3.5 - 5.1 mmol/L 4.1 4.1 4.6  Chloride 98 - 111 mmol/L 106 106 105  CO2 22 - 32 mmol/L _1 Calcium 8.9 - 10.3 mg/dL 9.3 9.5 9.4  Total Protein 6.5 - 8.1 g/dL 7.3 7.0 7.3  Total Bilirubin 0.3 - 1.2 mg/dL 0.6 0.8 0.6  Alkaline Phos 38 - 126 U/L 73 68 64  AST 15 - 41 U/L _2 ALT 0 - 44 U/L _3 11/03/17 Peripheral Blood Flow Cytometry:   11/03/17 FISH, CLL Prognostic Panel:   RADIOGRAPHIC STUDIES: I have personally reviewed the radiological images as listed and agreed with the findings in the report. No results found.  ASSESSMENT & PLAN:   60 y.o. female with  1. Chronic lymphocytic leukemia Rai stage 0 11/03/17 Peripheral blood flow cytometry positive for monoclonal lymphoid population comprising 83% of lymphoid events, consistent with CLL.  11/03/17 CLL FISH panel negative for trisomy 12, p53 deletion or amplification, and ATM deletion. Positive for [id number] (44% with bi-allelic deletion) 0/10/27 CT N/C/A/P  indicated increased number without pathological enlargement of LN in the neck, no pathological lymphadenopathy in the chest, abdomen of pelvis. No splenomegaly.   PLAN: -Discussed pt labwork today, 08/08/20; WBC looks good, Hgb & PLT are nml, blood chemistries are nml, LDH is WNL. -No lab or clinical evidence of CLL progression at this time. Will continue watchful observation. -Recommend pt continue sterile saline spray and use distilled water steam inhaler for sinus symptoms. -Advised pt that OTC supplements are not regulated by the FDA and some have been known to cause liver abnormalities. -Advised pt that EGCG supplements can cause weight loss and jitteriness. -Recommend pt take 2000 IU Vitamn D daily to keep levels high-normal.  -Will see back in 6 months with labs   FOLLOW UP: RTC with Dr Irene Limbo with labs in 6 months   The total time spent in the appt was 20 minutes and more than 50% was on counseling and direct patient cares.  All of the patient's questions were answered with apparent satisfaction. The patient knows to call the clinic with any problems, questions or concerns.   Sullivan Lone MD Benjamin Perez AAHIVMS [facility] Hematology/Oncology Physician [facility]  (Office):       [phone] (Work cell):  [phone] (Fax):           [phone]  08/08/2020 2:37 PM  I, Yevette Edwards, am acting as a scribe for Dr. Sullivan Lone.   .I have reviewed the above documentation for accuracy and completeness, and I agree with the above. Brunetta Genera MD

## 2020-08-09 ENCOUNTER — Telehealth: Payer: Self-pay | Admitting: Hematology

## 2020-08-09 NOTE — Telephone Encounter (Signed)
Scheduled appointment per 1/4 los. Spoke to patient who is aware of appointments dates and times.

## 2020-08-15 ENCOUNTER — Telehealth: Payer: Self-pay | Admitting: Family

## 2020-08-15 NOTE — Telephone Encounter (Signed)
Called to discuss with patient about COVID-19 symptoms and the use of one of the available treatments for those with mild to moderate Covid symptoms and at a high risk of hospitalization.  Pt appears to qualify for outpatient treatment due to co-morbid conditions and/or a member of an at-risk group in accordance with the FDA Emergency Use Authorization.    Symptom onset: 08/11/20 Vaccinated: Yes  Booster? Yes Qualifiers: CLL   Ms. Broeker had symptoms starting on 08/11/20. Covid test was positive on 1/10. Currently experiencing sore throat and is concern she may have strep throat. Awaiting testing by her PCP and would like to hold off treatment until then. Call back number provided should she wish to be screened.   Terri Piedra, NP 08/15/2020 2:39 PM

## 2020-08-17 ENCOUNTER — Telehealth: Payer: Self-pay | Admitting: *Deleted

## 2020-08-17 NOTE — Telephone Encounter (Signed)
Received Telephone Advice fax from after hours [facility]: patient called last night and was connected with [facility] MD. Patient positive for Covid, has respiratory symptoms and low temp. Patient was advised to call PCP for foolwoup and call Dr. Grier Mitts offic etoday. Patient seeking information about monoclonal antibody treatment IV or oral. Given phone numbers for HotLine (monoclonal therapy) and for Respiratory Clinic. Advised that OTC URI meds can be used for symptoms. Instructed to go to ED or call 911 for shortness of breath/high fever not reduced by meds/confusion. Patient verbalized understanding.

## 2021-02-24 NOTE — Progress Notes (Signed)
HEMATOLOGY/ONCOLOGY CLINIC NOTE  Date of Service: 02/24/2021  Patient Care Team: Mila Palmer, MD as PCP - General (Family Medicine)  CHIEF COMPLAINTS/PURPOSE OF CONSULTATION:  F/u of Chronic lymphocytic leukemia   HISTORY OF PRESENTING ILLNESS:   Cheryl Sims is a wonderful 60 y.o. female who has been previously followed by my colleague Dr. Milinda Antis for evaluation and management of Chronic lymphocytic leukemia. She is accompanied today by her husband. The pt reports that she is doing well overall.   The pt was diagnosed with CLL on 11/03/17, Rai stage 0 with an isolated presence of bi-allelic 13q deletion. The pt has been monitored thus far and has not begun any systemic treatments.   The pt reports that she went to the dermatologist last year for "weird" rashes on the back of her neck. She notes that this rash came and went, itched, and went away with antibiotics. She notes that this was believed to be a staph infection or folliculitis.   The pt denies any fatigue that limits her daily activities. She notes that her CLL diagnosis has led to some anxiety about her mortality. She notes that she took Paxel for a few years in her 30s and Welbutrin for a couple years in her 71s. She has a prescription for Xanax which she does not regularly use. The pt and her husband note that they've had unusually stressful events in their family in the last few years. The pt denies much affect to her quality of life or ability to function.   She denies any history of respiratory infections.   Most recent lab results (02/17/18) of CBC w/diff, CMP is as follows: all values are WNL except for WBC at 22.4k, Lymphs abs at 17.7k. LDH 02/17/18 is WNL at 178   On review of systems, pt reports good energy levels, some anxiety, occasional constipation, and denies fevers, chills, night sweats, unexpected weight loss, fatigue, mouth sores, noticing any new lumps or bumps, pain along the spine, abdominal pains,  and any other symptoms.   On PMHx the pt denies respiratory infections or problems. 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  Interval History:   Cheryl Sims returns today for management and evaluation of her CLL. The patient's last visit with [name] was on 08/08/2020. The pt reports that she is doing well overall.  The pt reports she has had some increased personal stress due to stress at work.  Notes that she has been trying to get more physically active. No fevers no chills no night sweats no new lumps or bumps noted. No new abdominal distention.  Lab results today 02/26/2021 of CBC w/diff WBC count of 32k normal hemoglobin of 14.6 and normal platelets of 267k CMP within normal limits LDH within normal limits at 157  On review of systems, pt reports no other acute new concerns.    MEDICAL HISTORY:  Past Medical History:  Diagnosis Date   Anxiety    H. pylori infection    IBS (irritable bowel syndrome)    Peptic ulcer due to Helicobacter pylori     SURGICAL HISTORY: No past surgical history on file.  SOCIAL HISTORY: Social History   Socioeconomic History   Marital status: Married    Spouse name: Not on file   Number of children: Not on file   Years of education: Not on file   Highest education level: Not on file  Occupational History   Not on file  Tobacco  Use   Smoking status: Never   Smokeless tobacco: Never  Vaping Use   Vaping Use: Never used  Substance and Sexual Activity   Alcohol use: Yes   Drug use: Never   Sexual activity: Yes  Other Topics Concern   Not on file  Social History Narrative   Not on file   Social Determinants of Health   Financial Resource Strain: Not on file  Food Insecurity: Not on file  Transportation Needs: Not on file  Physical Activity: Not on file  Stress: Not on file  Social Connections: Not on file  Intimate Partner Violence: Not on file    FAMILY HISTORY: Family History  Problem Relation Age of  Onset   Alzheimer's disease Father    Thyroid cancer Brother     ALLERGIES:  has No Known Allergies.  MEDICATIONS:  Current Outpatient Medications  Medication Sig Dispense Refill   ALPRAZolam (XANAX) 0.5 MG tablet TK 1/2 TO 1 T PO TID PRN  0   No current facility-administered medications for this visit.    REVIEW OF SYSTEMS:   10 Point review of Systems was done is negative except as noted above.  PHYSICAL EXAMINATION: ECOG FS:1 - Symptomatic but completely ambulatory  There were no vitals filed for this visit.  Wt Readings from Last 3 Encounters:  08/08/20 133 lb (60.3 kg)  01/14/20 135 lb 1.6 oz (61.3 kg)  07/16/19 136 lb 11.2 oz (62 kg)   There is no height or weight on file to calculate BMI.    No acute distress GENERAL:alert, in no acute distress and comfortable SKIN: no acute rashes, no significant lesions EYES: conjunctiva are pink and non-injected, sclera anicteric OROPHARYNX: MMM, no exudates, no oropharyngeal erythema or ulceration NECK: supple, no JVD LYMPH:  no palpable lymphadenopathy in the cervical, axillary or inguinal regions LUNGS: clear to auscultation b/l with normal respiratory effort HEART: regular rate & rhythm ABDOMEN:  normoactive bowel sounds , non tender, not distended. No palpable hepatosplenomegaly.  Extremity: no pedal edema PSYCH: alert & oriented x 3 with fluent speech NEURO: no focal motor/sensory deficits  LABORATORY DATA:  I have reviewed the data as listed  . CBC Latest Ref Rng & Units 02/26/2021 08/08/2020 01/14/2020  WBC 4.0 - 10.5 K/uL 32.0(H) 26.5(H) 31.4(H)  Hemoglobin 12.0 - 15.0 g/dL 14.6 14.7 14.3  Hematocrit 36.0 - 46.0 % 43.6 44.8 43.7  Platelets 150 - 400 K/uL 267 276 299    CBC    Component Value Date/Time   WBC 32.0 (H) 02/26/2021 0817   RBC 4.73 02/26/2021 0817   HGB 14.6 02/26/2021 0817   HGB 14.5 02/17/2018 0947   HCT 43.6 02/26/2021 0817   PLT 267 02/26/2021 0817   PLT 288 02/17/2018 0947   MCV 92.2  02/26/2021 0817   MCH 30.9 02/26/2021 0817   MCHC 33.5 02/26/2021 0817   RDW 12.7 02/26/2021 0817   LYMPHSABS 28.6 (H) 02/26/2021 0817   MONOABS 0.6 02/26/2021 0817   EOSABS 0.3 02/26/2021 0817   BASOSABS 0.1 02/26/2021 0817    . CMP Latest Ref Rng & Units 02/26/2021 08/08/2020 01/14/2020  Glucose 70 - 99 mg/dL 74 83 89  BUN 6 - 20 mg/dL $Remove'12 8 9  'CxUOTgO$ Creatinine 0.44 - 1.00 mg/dL 0.71 0.75 0.72  Sodium 135 - 145 mmol/L 142 139 142  Potassium 3.5 - 5.1 mmol/L 4.5 4.1 4.1  Chloride 98 - 111 mmol/L 107 106 106  CO2 22 - 32 mmol/L $RemoveB'25 26 26  'jqXZFgry$ Calcium  8.9 - 10.3 mg/dL 9.7 9.3 9.5  Total Protein 6.5 - 8.1 g/dL 6.9 7.3 7.0  Total Bilirubin 0.3 - 1.2 mg/dL 0.8 0.6 0.8  Alkaline Phos 38 - 126 U/L 66 73 68  AST 15 - 41 U/L $Remo'16 22 19  'bzkEd$ ALT 0 - 44 U/L $Remo'10 22 15    'Mndby$ 11/03/17 Peripheral Blood Flow Cytometry:   11/03/17 FISH, CLL Prognostic Panel:   RADIOGRAPHIC STUDIES: I have personally reviewed the radiological images as listed and agreed with the findings in the report. No results found.  ASSESSMENT & PLAN:   60 y.o. female with  1. Chronic lymphocytic leukemia Rai stage 0 11/03/17 Peripheral blood flow cytometry positive for monoclonal lymphoid population comprising 83% of lymphoid events, consistent with CLL.  11/03/17 CLL FISH panel negative for trisomy 12, p53 deletion or amplification, and ATM deletion. Positive for [id number] (93% with bi-allelic deletion) 2/41/99 CT N/C/A/P indicated increased number without pathological enlargement of LN in the neck, no pathological lymphadenopathy in the chest, abdomen of pelvis. No splenomegaly.   PLAN: -Discussed pt labwork today, 02/26/2021 -CBC with leukocytosis of 32k which is not a significant progression from her previous labs.  No anemia no thrombocytopenia. LDH remains within normal limits Patient has no overt clinically palpable peripheral lymphadenopathy or significant splenomegaly. No constitutional symptoms No lab or clinical evidence of  significant symptomatic CLL progression at this time.  No indication for initiating treatment at this time . -will continue watchful observation. -Recommend pt take 2000 IU Vitamn D daily to keep levels high-normal.  -Will see back in 6 months   FOLLOW UP: RTC with Dr Irene Limbo with labs in 6 months   The total time spent in the appt was 20 minutes and more than 50% was on counseling and direct patient cares.  All of the patient's questions were answered with apparent satisfaction. The patient knows to call the clinic with any problems, questions or concerns.   Sullivan Lone MD [place] AAHIVMS [facility] Hematology/Oncology Physician [facility]  (Office):       [phone] (Work cell):  [phone] (Fax):           [phone]  02/24/2021 11:12 AM  I, Reinaldo Raddle, am acting as scribe for Dr. Sullivan Lone, MD.     .I have reviewed the above documentation for accuracy and completeness, and I agree with the above. Brunetta Genera MD

## 2021-02-26 ENCOUNTER — Other Ambulatory Visit: Payer: Self-pay

## 2021-02-26 ENCOUNTER — Inpatient Hospital Stay: Payer: Commercial Managed Care - PPO | Attending: Hematology

## 2021-02-26 ENCOUNTER — Inpatient Hospital Stay (HOSPITAL_BASED_OUTPATIENT_CLINIC_OR_DEPARTMENT_OTHER): Payer: Commercial Managed Care - PPO | Admitting: Hematology

## 2021-02-26 VITALS — BP 113/82 | HR 61 | Temp 98.4°F | Resp 17 | Wt 140.7 lb

## 2021-02-26 DIAGNOSIS — C9110 Chronic lymphocytic leukemia of B-cell type not having achieved remission: Secondary | ICD-10-CM | POA: Insufficient documentation

## 2021-02-26 LAB — CBC WITH DIFFERENTIAL/PLATELET
Abs Immature Granulocytes: 0.03 10*3/uL (ref 0.00–0.07)
Basophils Absolute: 0.1 10*3/uL (ref 0.0–0.1)
Basophils Relative: 0 %
Eosinophils Absolute: 0.3 10*3/uL (ref 0.0–0.5)
Eosinophils Relative: 1 %
HCT: 43.6 % (ref 36.0–46.0)
Hemoglobin: 14.6 g/dL (ref 12.0–15.0)
Immature Granulocytes: 0 %
Lymphocytes Relative: 90 %
Lymphs Abs: 28.6 10*3/uL — ABNORMAL HIGH (ref 0.7–4.0)
MCH: 30.9 pg (ref 26.0–34.0)
MCHC: 33.5 g/dL (ref 30.0–36.0)
MCV: 92.2 fL (ref 80.0–100.0)
Monocytes Absolute: 0.6 10*3/uL (ref 0.1–1.0)
Monocytes Relative: 2 %
Neutro Abs: 2.4 10*3/uL (ref 1.7–7.7)
Neutrophils Relative %: 7 %
Platelets: 267 10*3/uL (ref 150–400)
RBC: 4.73 MIL/uL (ref 3.87–5.11)
RDW: 12.7 % (ref 11.5–15.5)
WBC: 32 10*3/uL — ABNORMAL HIGH (ref 4.0–10.5)
nRBC: 0 % (ref 0.0–0.2)

## 2021-02-26 LAB — CMP (CANCER CENTER ONLY)
ALT: 10 U/L (ref 0–44)
AST: 16 U/L (ref 15–41)
Albumin: 4.3 g/dL (ref 3.5–5.0)
Alkaline Phosphatase: 66 U/L (ref 38–126)
Anion gap: 10 (ref 5–15)
BUN: 12 mg/dL (ref 6–20)
CO2: 25 mmol/L (ref 22–32)
Calcium: 9.7 mg/dL (ref 8.9–10.3)
Chloride: 107 mmol/L (ref 98–111)
Creatinine: 0.71 mg/dL (ref 0.44–1.00)
GFR, Estimated: >60 mL/min (ref >60)
Glucose, Bld: 74 mg/dL (ref 70–99)
Potassium: 4.5 mmol/L (ref 3.5–5.1)
Sodium: 142 mmol/L (ref 135–145)
Total Bilirubin: 0.8 mg/dL (ref 0.3–1.2)
Total Protein: 6.9 g/dL (ref 6.5–8.1)

## 2021-02-26 LAB — LACTATE DEHYDROGENASE: LDH: 157 U/L (ref 98–192)

## 2021-08-24 ENCOUNTER — Telehealth: Payer: Self-pay | Admitting: Hematology

## 2021-08-24 NOTE — Telephone Encounter (Signed)
Sch per 1/20 inb, pt aware

## 2021-08-27 ENCOUNTER — Inpatient Hospital Stay: Payer: Commercial Managed Care - PPO

## 2021-08-27 ENCOUNTER — Inpatient Hospital Stay: Payer: Commercial Managed Care - PPO | Admitting: Hematology

## 2021-09-14 ENCOUNTER — Inpatient Hospital Stay (HOSPITAL_BASED_OUTPATIENT_CLINIC_OR_DEPARTMENT_OTHER): Payer: Commercial Managed Care - PPO | Admitting: Hematology

## 2021-09-14 ENCOUNTER — Other Ambulatory Visit: Payer: Self-pay

## 2021-09-14 ENCOUNTER — Inpatient Hospital Stay: Payer: Commercial Managed Care - PPO | Attending: Hematology

## 2021-09-14 VITALS — BP 121/87 | HR 120 | Temp 97.5°F | Resp 20 | Wt 143.4 lb

## 2021-09-14 DIAGNOSIS — C9110 Chronic lymphocytic leukemia of B-cell type not having achieved remission: Secondary | ICD-10-CM | POA: Insufficient documentation

## 2021-09-14 LAB — CBC WITH DIFFERENTIAL/PLATELET
Abs Immature Granulocytes: 0.03 K/uL (ref 0.00–0.07)
Basophils Absolute: 0.1 K/uL (ref 0.0–0.1)
Basophils Relative: 0 %
Eosinophils Absolute: 0.2 K/uL (ref 0.0–0.5)
Eosinophils Relative: 1 %
HCT: 43.4 % (ref 36.0–46.0)
Hemoglobin: 14.6 g/dL (ref 12.0–15.0)
Immature Granulocytes: 0 %
Lymphocytes Relative: 91 %
Lymphs Abs: 32.2 K/uL — ABNORMAL HIGH (ref 0.7–4.0)
MCH: 30.6 pg (ref 26.0–34.0)
MCHC: 33.6 g/dL (ref 30.0–36.0)
MCV: 91 fL (ref 80.0–100.0)
Monocytes Absolute: 0.6 K/uL (ref 0.1–1.0)
Monocytes Relative: 2 %
Neutro Abs: 2.1 K/uL (ref 1.7–7.7)
Neutrophils Relative %: 6 %
Platelets: 297 K/uL (ref 150–400)
RBC: 4.77 MIL/uL (ref 3.87–5.11)
RDW: 12.5 % (ref 11.5–15.5)
Smear Review: NORMAL
WBC: 35.2 K/uL — ABNORMAL HIGH (ref 4.0–10.5)
nRBC: 0 % (ref 0.0–0.2)

## 2021-09-14 LAB — CMP (CANCER CENTER ONLY)
ALT: 10 U/L (ref 0–44)
AST: 16 U/L (ref 15–41)
Albumin: 4.6 g/dL (ref 3.5–5.0)
Alkaline Phosphatase: 59 U/L (ref 38–126)
Anion gap: 6 (ref 5–15)
BUN: 11 mg/dL (ref 6–20)
CO2: 28 mmol/L (ref 22–32)
Calcium: 9.5 mg/dL (ref 8.9–10.3)
Chloride: 106 mmol/L (ref 98–111)
Creatinine: 0.67 mg/dL (ref 0.44–1.00)
GFR, Estimated: >60 mL/min (ref >60)
Glucose, Bld: 93 mg/dL (ref 70–99)
Potassium: 4.4 mmol/L (ref 3.5–5.1)
Sodium: 140 mmol/L (ref 135–145)
Total Bilirubin: 0.7 mg/dL (ref 0.3–1.2)
Total Protein: 7 g/dL (ref 6.5–8.1)

## 2021-09-14 LAB — LACTATE DEHYDROGENASE: LDH: 142 U/L (ref 98–192)

## 2021-09-20 NOTE — Progress Notes (Addendum)
HEMATOLOGY/ONCOLOGY CLINIC NOTE  Date of Service:  09/14/2021   Patient Care Team: Mila Palmer, MD as PCP - General (Family Medicine)  CHIEF COMPLAINTS/PURPOSE OF CONSULTATION:  Follow-up for continued management of CLL  HISTORY OF PRESENTING ILLNESS:  Please see previous note for details on initial presentation.  Interval History:   Cheryl Sims is here for follow-up for continued evaluation and management of her CLL. She notes she has been doing well overall and has been eating healthy and staying quite physically active. No new fevers, chills, night sweats or unexpected weight loss. No new lumps or bumps. No new chest pain shortness of breath abdominal pain or distention.  Labs done today were reviewed with her in detail.   MEDICAL HISTORY:  Past Medical History:  Diagnosis Date   Anxiety    H. pylori infection    IBS (irritable bowel syndrome)    Peptic ulcer due to Helicobacter pylori     SURGICAL HISTORY: No past surgical history on file.  SOCIAL HISTORY: Social History   Socioeconomic History   Marital status: Married    Spouse name: Not on file   Number of children: Not on file   Years of education: Not on file   Highest education level: Not on file  Occupational History   Not on file  Tobacco Use   Smoking status: Never   Smokeless tobacco: Never  Vaping Use   Vaping Use: Never used  Substance and Sexual Activity   Alcohol use: Yes   Drug use: Never   Sexual activity: Yes  Other Topics Concern   Not on file  Social History Narrative   Not on file   Social Determinants of Health   Financial Resource Strain: Not on file  Food Insecurity: Not on file  Transportation Needs: Not on file  Physical Activity: Not on file  Stress: Not on file  Social Connections: Not on file  Intimate Partner Violence: Not on file    FAMILY HISTORY: Family History  Problem Relation Age of Onset   Alzheimer's disease Father    Thyroid cancer Brother      ALLERGIES:  has No Known Allergies.  MEDICATIONS:  Current Outpatient Medications  Medication Sig Dispense Refill   ALPRAZolam (XANAX) 0.5 MG tablet TK 1/2 TO 1 T PO TID PRN  0   escitalopram (LEXAPRO) 10 MG tablet Take 10 mg by mouth daily.     No current facility-administered medications for this visit.    REVIEW OF SYSTEMS:   10 Point review of Systems was done is negative except as noted above.  PHYSICAL EXAMINATION: ECOG FS:1 - Symptomatic but completely ambulatory  Vitals:   09/14/21 0910  BP: 121/87  Pulse: (!) 120  Resp: 20  Temp: (!) 97.5 F (36.4 C)  SpO2: 100%    Wt Readings from Last 3 Encounters:  09/14/21 143 lb 6.4 oz (65 kg)  02/26/21 140 lb 11.2 oz (63.8 kg)  08/08/20 133 lb (60.3 kg)   Body mass index is 21.18 kg/m.   NAD GENERAL:alert, in no acute distress and comfortable SKIN: no acute rashes, no significant lesions EYES: conjunctiva are pink and non-injected, sclera anicteric OROPHARYNX: MMM, no exudates, no oropharyngeal erythema or ulceration NECK: supple, no JVD LYMPH:  no palpable lymphadenopathy in the cervical, axillary or inguinal regions LUNGS: clear to auscultation b/l with normal respiratory effort HEART: regular rate & rhythm ABDOMEN:  normoactive bowel sounds , non tender, not distended. Extremity: no pedal edema PSYCH:  alert & oriented x 3 with fluent speech NEURO: no focal motor/sensory deficits  LABORATORY DATA:  I have reviewed the data as listed  . CBC Latest Ref Rng & Units 09/14/2021 02/26/2021 08/08/2020  WBC 4.0 - 10.5 K/uL 35.2(H) 32.0(H) 26.5(H)  Hemoglobin 12.0 - 15.0 g/dL 14.6 14.6 14.7  Hematocrit 36.0 - 46.0 % 43.4 43.6 44.8  Platelets 150 - 400 K/uL 297 267 276    CBC    Component Value Date/Time   WBC 35.2 (H) 09/14/2021 0850   RBC 4.77 09/14/2021 0850   HGB 14.6 09/14/2021 0850   HGB 14.5 02/17/2018 0947   HCT 43.4 09/14/2021 0850   PLT 297 09/14/2021 0850   PLT 288 02/17/2018 0947   MCV 91.0  09/14/2021 0850   MCH 30.6 09/14/2021 0850   MCHC 33.6 09/14/2021 0850   RDW 12.5 09/14/2021 0850   LYMPHSABS 32.2 (H) 09/14/2021 0850   MONOABS 0.6 09/14/2021 0850   EOSABS 0.2 09/14/2021 0850   BASOSABS 0.1 09/14/2021 0850    . CMP Latest Ref Rng & Units 09/14/2021 02/26/2021 08/08/2020  Glucose 70 - 99 mg/dL 93 74 83  BUN 6 - 20 mg/dL $Remove'11 12 8  'jkTmYWE$ Creatinine 0.44 - 1.00 mg/dL 0.67 0.71 0.75  Sodium 135 - 145 mmol/L 140 142 139  Potassium 3.5 - 5.1 mmol/L 4.4 4.5 4.1  Chloride 98 - 111 mmol/L 106 107 106  CO2 22 - 32 mmol/L $RemoveB'28 25 26  'EOdiOONY$ Calcium 8.9 - 10.3 mg/dL 9.5 9.7 9.3  Total Protein 6.5 - 8.1 g/dL 7.0 6.9 7.3  Total Bilirubin 0.3 - 1.2 mg/dL 0.7 0.8 0.6  Alkaline Phos 38 - 126 U/L 59 66 73  AST 15 - 41 U/L $Remo'16 16 22  'UaJpo$ ALT 0 - 44 U/L $Remo'10 10 22   'vqwvF$ . Lab Results  Component Value Date   LDH 142 09/14/2021     11/03/17 Peripheral Blood Flow Cytometry:   11/03/17 FISH, CLL Prognostic Panel:   RADIOGRAPHIC STUDIES: I have personally reviewed the radiological images as listed and agreed with the findings in the report. No results found.  ASSESSMENT & PLAN:   61 y.o. female with  1. Chronic lymphocytic leukemia Rai stage 0 11/03/17 Peripheral blood flow cytometry positive for monoclonal lymphoid population comprising 83% of lymphoid events, consistent with CLL.  11/03/17 CLL FISH panel negative for trisomy 12, p53 deletion or amplification, and ATM deletion. Positive for [id number] (42% with bi-allelic deletion) 3/53/61 CT N/C/A/P indicated increased number without pathological enlargement of LN in the neck, no pathological lymphadenopathy in the chest, abdomen of pelvis. No splenomegaly.   PLAN: -Patient notes no new constitutional symptoms or other symptoms of focal CLL progression at this time. -Labs done today show gradual increase in her WBC count but no associated anemia or thrombocytopenia. -Patient has no clinical symptoms or lab changes suggesting need for initiation of CLL  treatment at this time. -She will continue active surveillance. -She was recommended to continue her vitamin D 2000 international units daily.   -Encouraged her to continue maintaining her healthy diet and exercise routine which she has been doing a good job with. -We will continue to monitor counts clinical examination in 6 months.   FOLLOW UP: RTC with Dr Irene Limbo with labs in 6 months  All of the patient's questions were answered with apparent satisfaction. The patient knows to call the clinic with any problems, questions or concerns.   Sullivan Lone MD [place] AAHIVMS [facility] Hematology/Oncology Physician [place]  [facility]

## 2021-10-12 NOTE — Progress Notes (Signed)
?Charlann Boxer D.O. ?[facility] ?[place] ?Phone: [phone] ?Subjective:   ?I, Cheryl Sims, am serving as a Education administrator for Dr. Hulan Saas. ?This visit occurred during the SARS-CoV-2 public health emergency.  Safety protocols were in place, including screening questions prior to the visit, additional usage of staff PPE, and extensive cleaning of exam room while observing appropriate contact time as indicated for disinfecting solutions.  ? ?I'm seeing this patient by the request  of:  [name], MD ? ?CC: low back pain  ? ?YIR:[id number]  ?Leronda Lewers Chaviano is a 61 y.o. female coming in with complaint of LBP and groin pain. LBP has been there for about a year. Running a lot slower and stride is off. Feels crippled after run. Most of the pain over SI joint. Feels it most in back of left leg. Ache pain in nature. Groin pain comes and goes. Also achy and dull pain on left side. Broke her left foot at 21 and just let it heal. Ibuprofen helps with pain. ? ? ? ?  ? ?Past Medical History:  ?Diagnosis Date  ? Anxiety   ? H. pylori infection   ? IBS (irritable bowel syndrome)   ? Peptic ulcer due to Helicobacter pylori   ? ?No past surgical history on file. ?Social History  ? ?Socioeconomic History  ? Marital status: Married  ?  Spouse name: Not on file  ? Number of children: Not on file  ? Years of education: Not on file  ? Highest education level: Not on file  ?Occupational History  ? Not on file  ?Tobacco Use  ? Smoking status: Never  ? Smokeless tobacco: Never  ?Vaping Use  ? Vaping Use: Never used  ?Substance and Sexual Activity  ? Alcohol use: Yes  ? Drug use: Never  ? Sexual activity: Yes  ?Other Topics Concern  ? Not on file  ?Social History Narrative  ? Not on file  ? ?Social Determinants of Health  ? ?Financial Resource Strain: Not on file  ?Food Insecurity: Not on file  ?Transportation Needs: Not on file  ?Physical Activity: Not on file  ?Stress: Not on file  ?Social  Connections: Not on file  ? ?No Known Allergies ?Family History  ?Problem Relation Age of Onset  ? Alzheimer's disease Father   ? Thyroid cancer Brother   ? ? ? ? ? ? ? ?Current Outpatient Medications (Other):  ?  gabapentin (NEURONTIN) 100 MG capsule, Take 2 capsules (200 mg total) by mouth at bedtime. ?  ALPRAZolam (XANAX) 0.5 MG tablet, TK 1/2 TO 1 T PO TID PRN ?  escitalopram (LEXAPRO) 10 MG tablet, Take 10 mg by mouth daily. ? ? ?Reviewed prior external information including notes and imaging from  ?primary care provider ?As well as notes that were available from care everywhere and other healthcare systems. ? ?Past medical history, social, surgical and family history all reviewed in electronic medical record.  No pertanent information unless stated regarding to the chief complaint.  ? ?Review of Systems: ? No headache, visual changes, nausea, vomiting, diarrhea, constipation, dizziness, abdominal pain, skin rash, fevers, chills, night sweats, weight loss, swollen lymph nodes, body aches, joint swelling, chest pain, shortness of breath, mood changes. POSITIVE muscle aches ? ?Objective  ?Blood pressure 118/82, pulse 73, height '5\' 9"'$  (1.753 m), weight 141 lb (64 kg), SpO2 97 %. ?  ?General: No apparent distress alert and oriented x3 mood and affect normal, dressed appropriately.  ?  HEENT: Pupils equal, extraocular movements intact  ?Respiratory: Patient's speak in full sentences and does not appear short of breath  ?Cardiovascular: No lower extremity edema, non tender, no erythema  ?Gait normal with good balance and coordination.  ?MSK: Patient back exam does show some very mild scoliosis.  Significant tightness noted with flexion of only 25 degrees and extension of only 5 degrees.  Patient does have tenderness to palpation more left greater than right of the paraspinal musculature.  Patient actually has some mild atrophy of the musculature of the gluteal area on the left side.  Dorsiflexion of the left leg and  ankle shows that patient does have 4-5 strength which is weak compared to the contralateral side.  Deep tendon reflexes are intact.  Mild midline tenderness noted at the L5 area. ? ? ?09811; 15 additional minutes spent for Therapeutic exercises as stated in above notes.  This included exercises focusing on stretching, strengthening, with significant focus on eccentric aspects.   Long term goals include an improvement in range of motion, strength, endurance as well as avoiding reinjury. Patient's frequency would include in 1-2 times a day, 3-5 times a week for a duration of 6-12 weeks.  Low back exercises that included:  ?Pelvic tilt/bracing instruction to focus on control of the pelvic girdle and lower abdominal muscles  ?Glute strengthening exercises, focusing on proper firing of the glutes without engaging the low back muscles ?Proper stretching techniques for maximum relief for the hamstrings, hip flexors, low back and some rotation where tolerated ?Proper technique shown and discussed handout in great detail with ATC.  All questions were discussed and answered.  ? ?  ?Impression and Recommendations:  ?  ? ?The above documentation has been reviewed and is accurate and complete Lyndal Pulley, DO ? ? ? ? ?

## 2021-10-15 ENCOUNTER — Other Ambulatory Visit: Payer: Self-pay

## 2021-10-15 ENCOUNTER — Ambulatory Visit (INDEPENDENT_AMBULATORY_CARE_PROVIDER_SITE_OTHER): Payer: Commercial Managed Care - PPO

## 2021-10-15 ENCOUNTER — Ambulatory Visit (INDEPENDENT_AMBULATORY_CARE_PROVIDER_SITE_OTHER): Payer: Commercial Managed Care - PPO | Admitting: Family Medicine

## 2021-10-15 DIAGNOSIS — M5442 Lumbago with sciatica, left side: Secondary | ICD-10-CM | POA: Diagnosis not present

## 2021-10-15 DIAGNOSIS — M545 Low back pain, unspecified: Secondary | ICD-10-CM | POA: Insufficient documentation

## 2021-10-15 DIAGNOSIS — G8929 Other chronic pain: Secondary | ICD-10-CM | POA: Diagnosis not present

## 2021-10-15 IMAGING — DX DG LUMBAR SPINE 2-3V
3 series · 3 of 3 positions shown · non-contrast
Comparison: CT abdomen and pelvis [DATE].

CLINICAL DATA: Left-sided back pain.

EXAM:
LUMBAR SPINE - 2-3 VIEW

[l-spine ap]
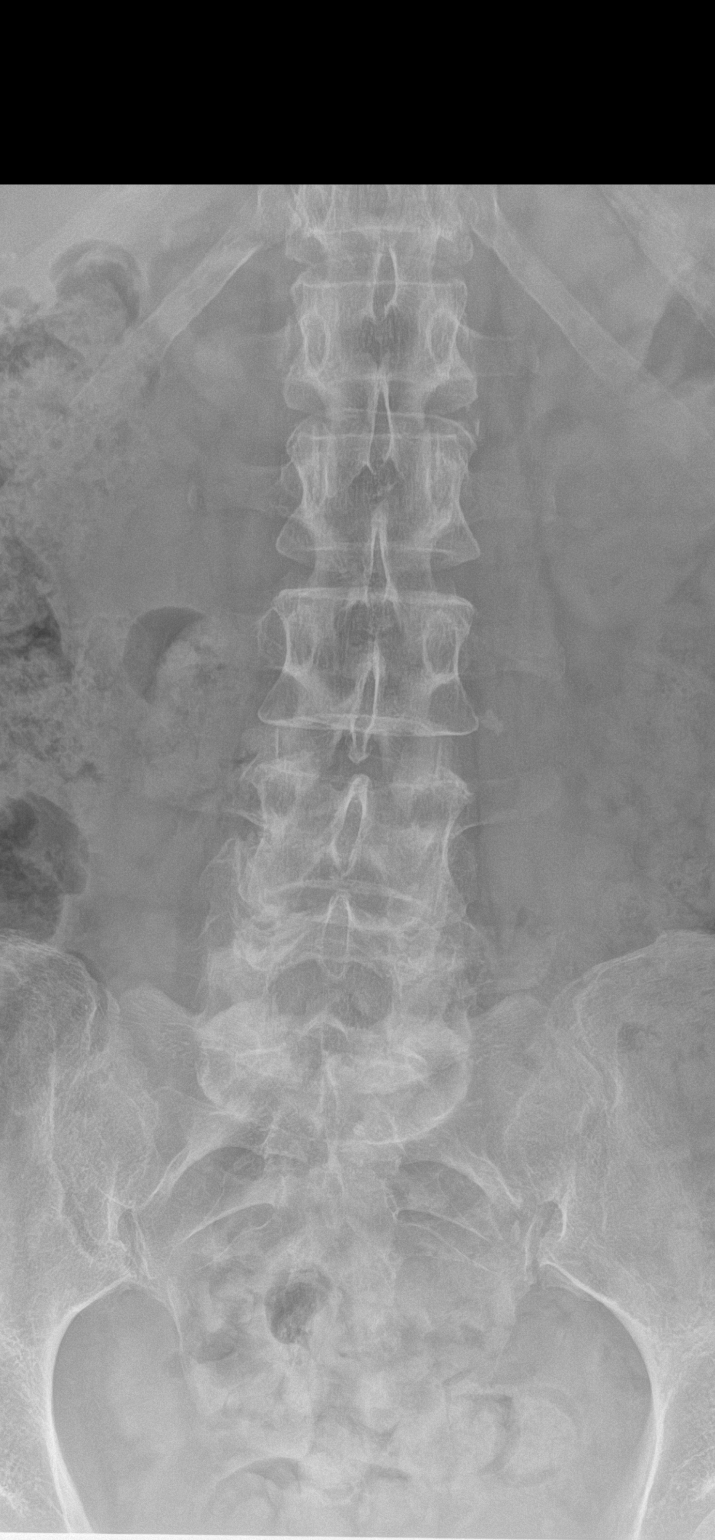

[l-spine lateral]
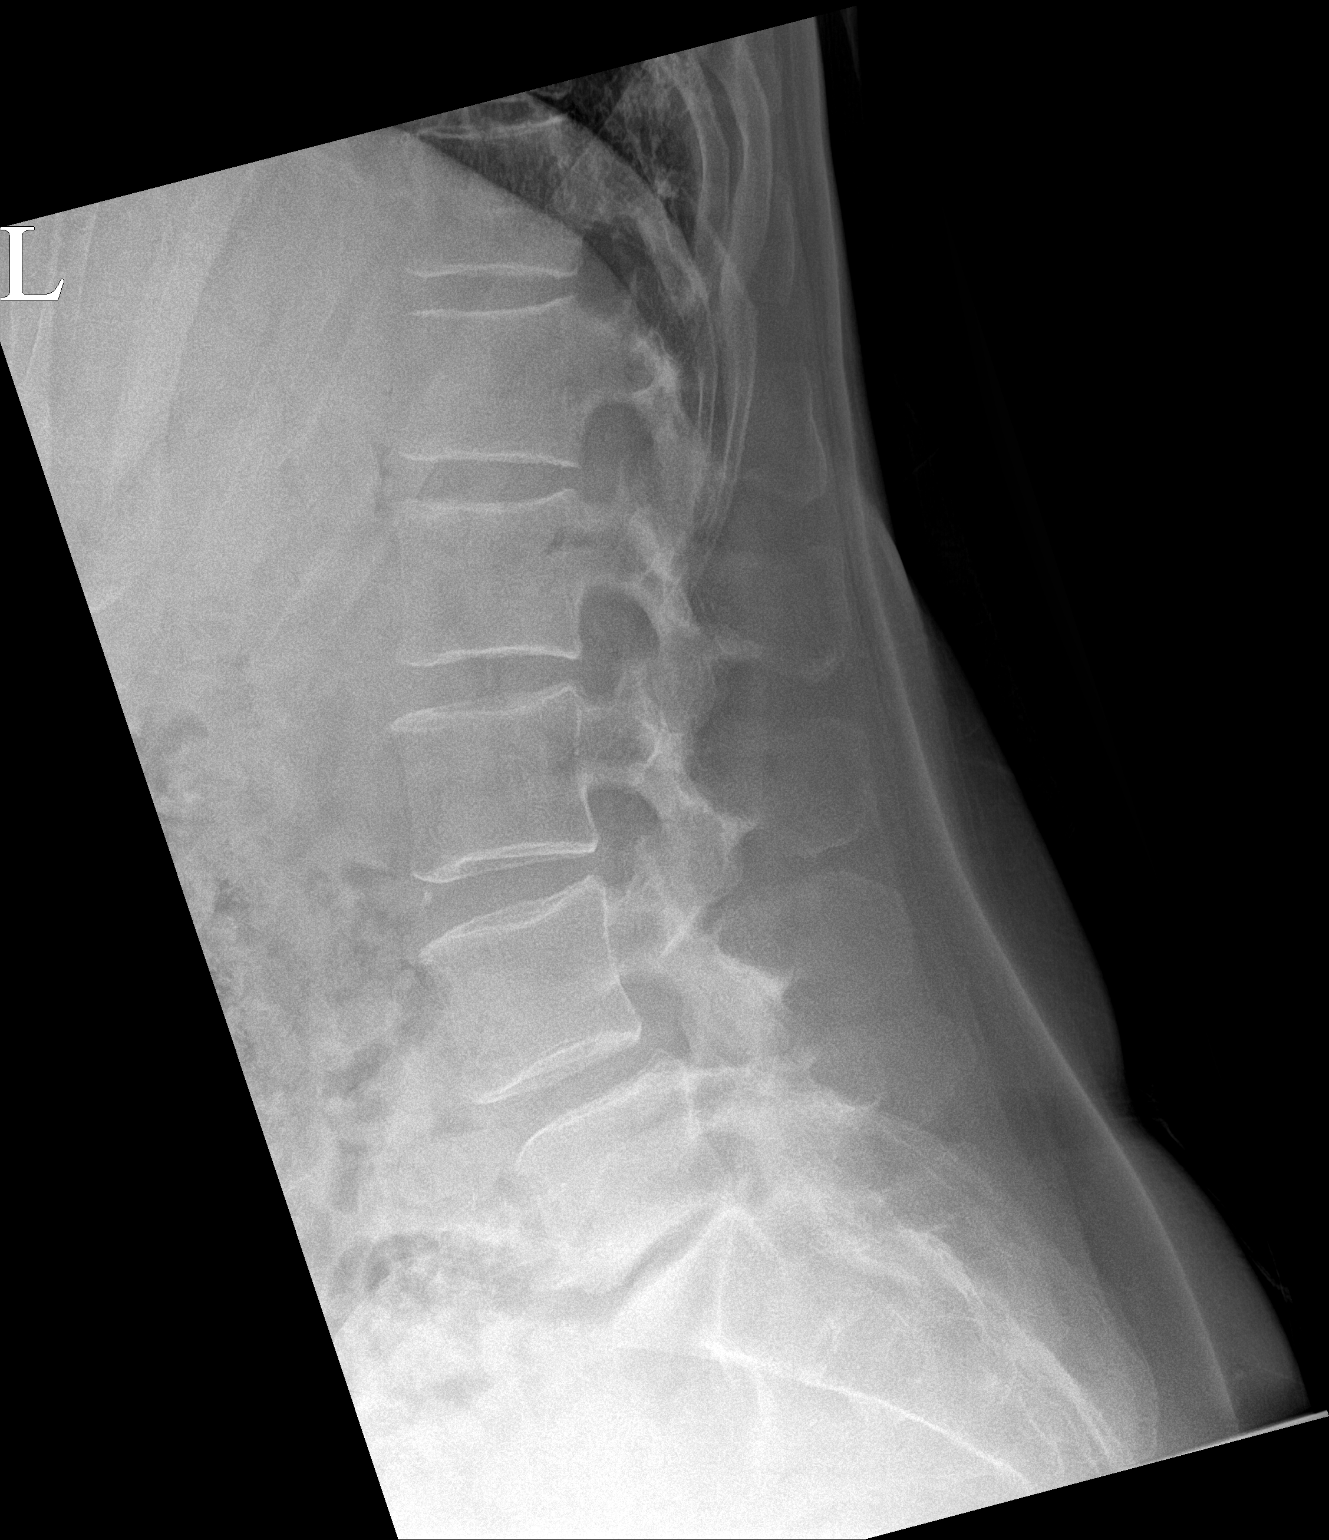

[l-spine spot]
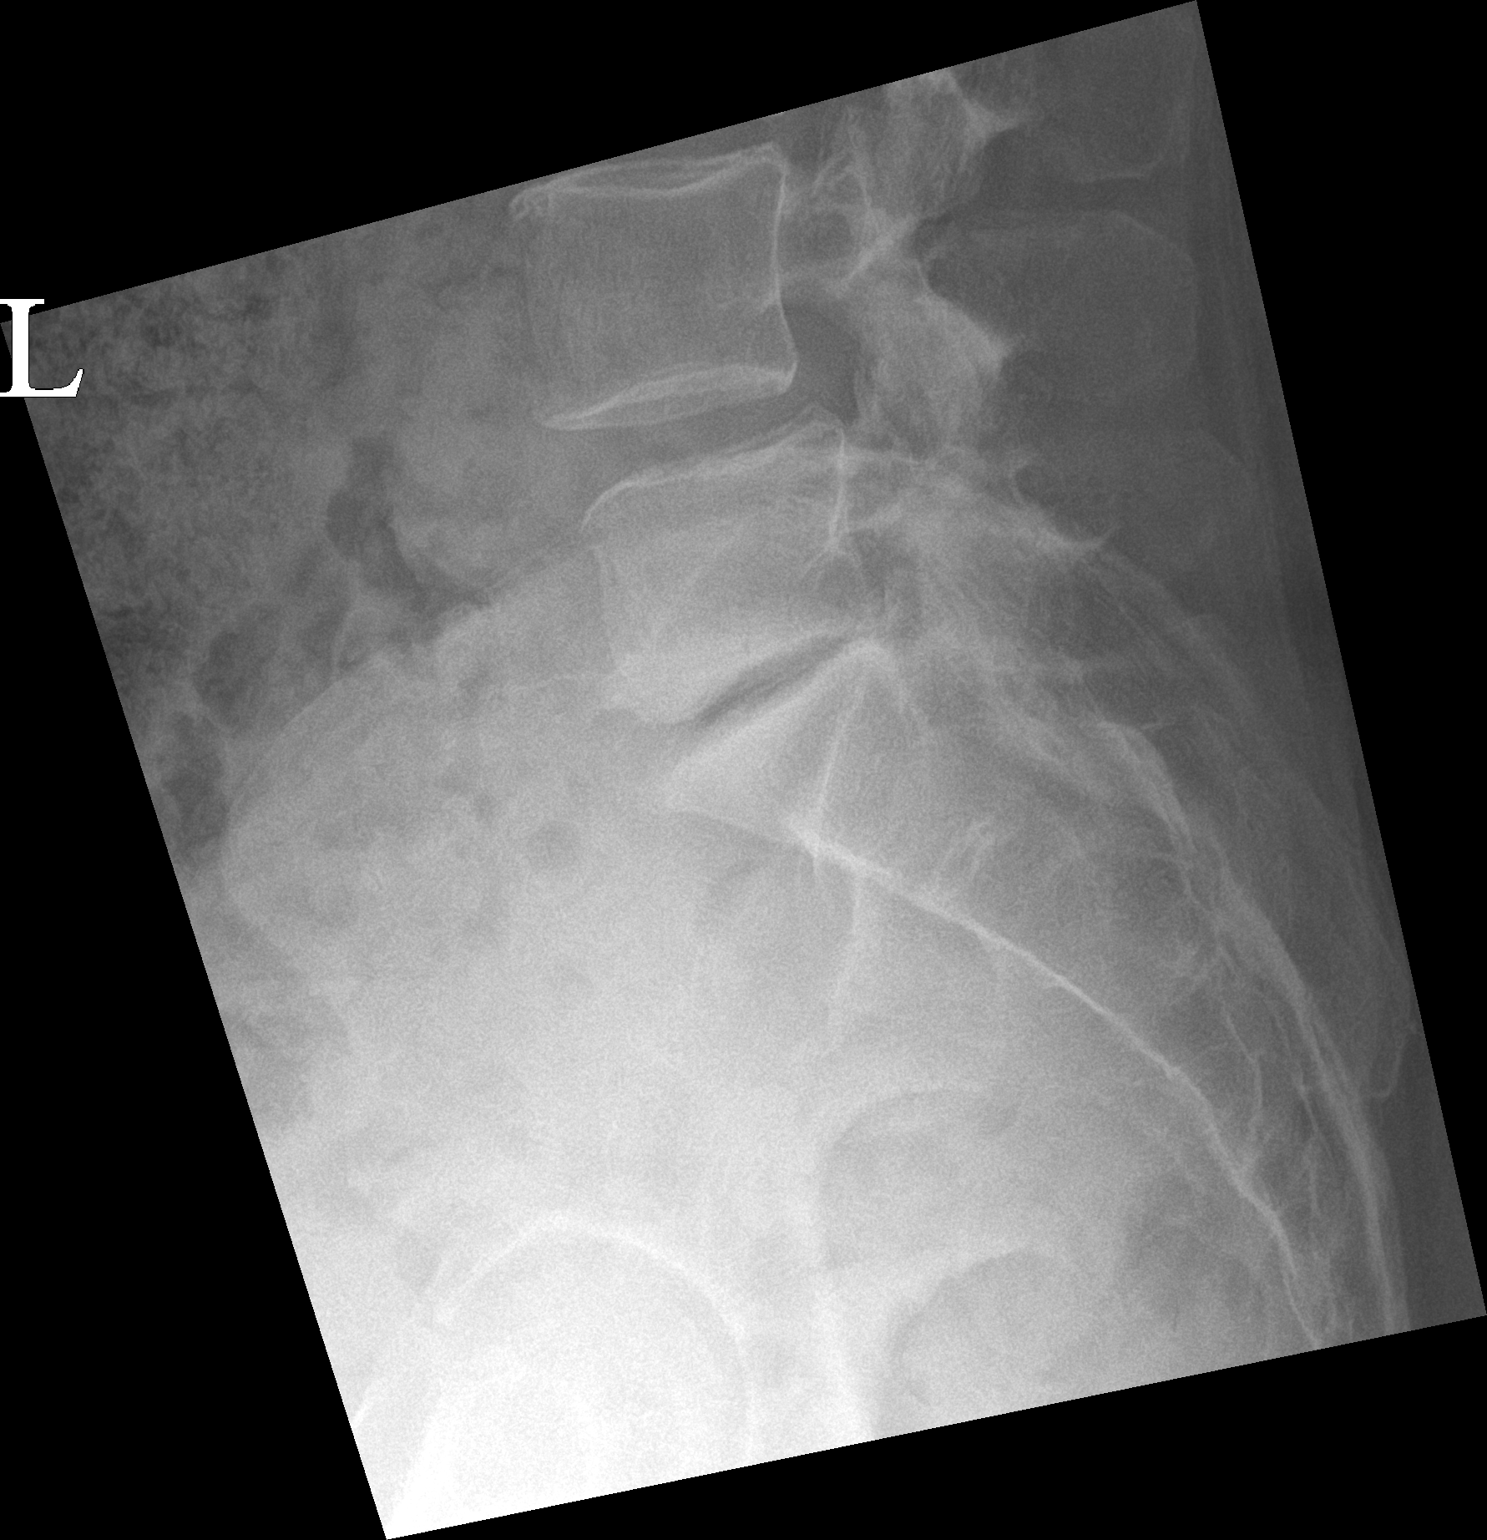

[3 of 3 positions shown; findings below may reference images not displayed]

FINDINGS: There is no evidence of lumbar spine fracture. Alignment is normal.
There is moderate intervertebral disc space narrowing at L5-S1 with
endplate sclerosis and osteophyte formation compatible with
degenerative change.
IMPRESSION: 1. No acute bony abnormality.
2. Moderate degenerative changes at L5-S1.

## 2021-10-15 IMAGING — DX DG HIP (WITH OR WITHOUT PELVIS) 2-3V*L*
3 series · 3 of 3 positions shown · non-contrast
Comparison: None.

CLINICAL DATA: Left-sided hip pain.

EXAM:
DG HIP (WITH OR WITHOUT PELVIS) 2-3V LEFT

[pelvis ap]
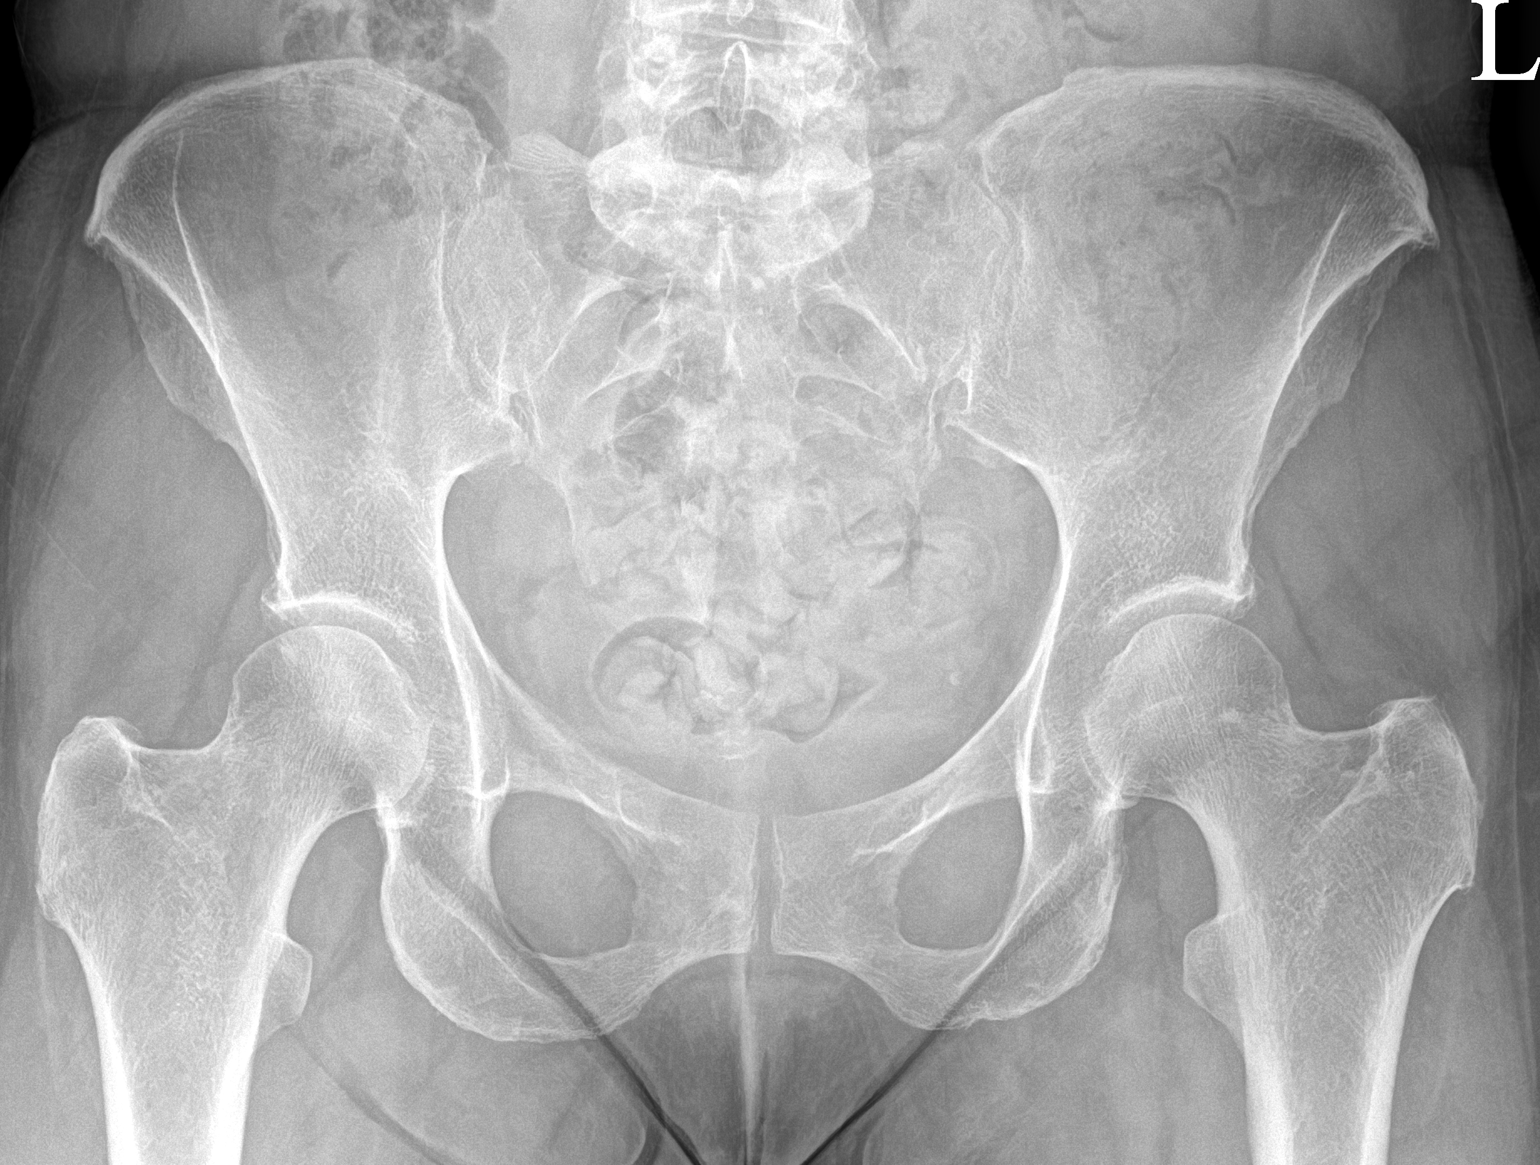

[hip ap]
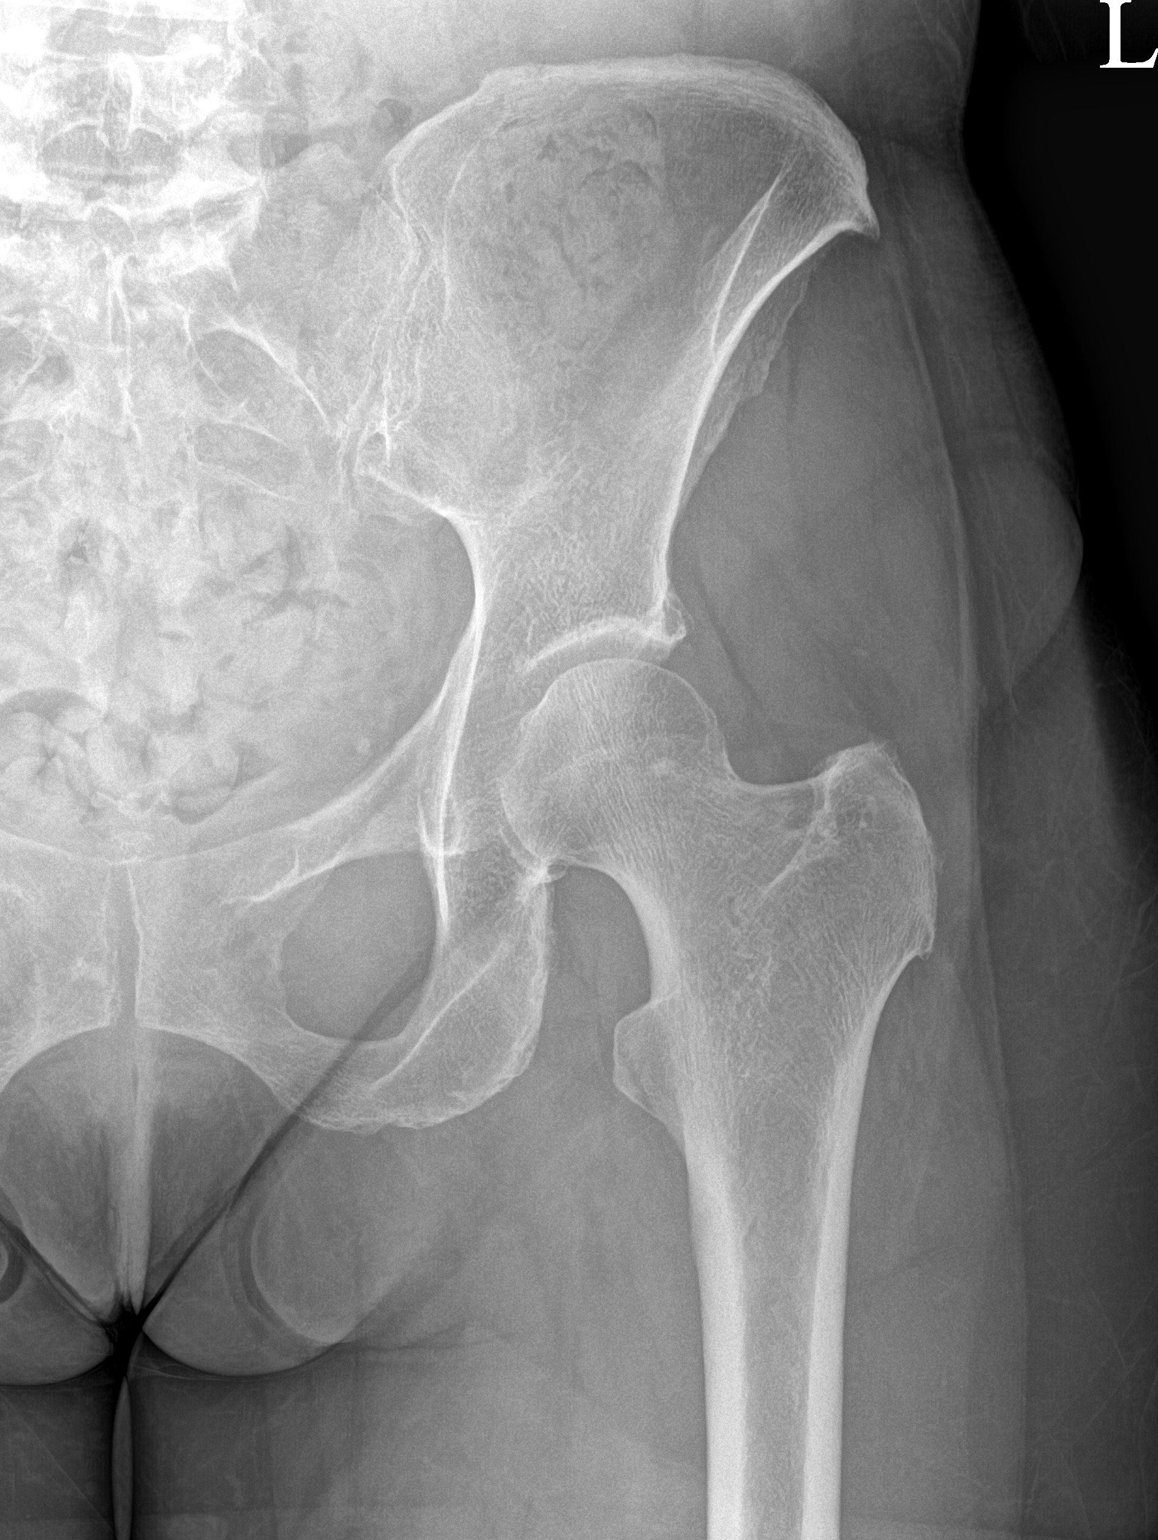

[hip frog leg]
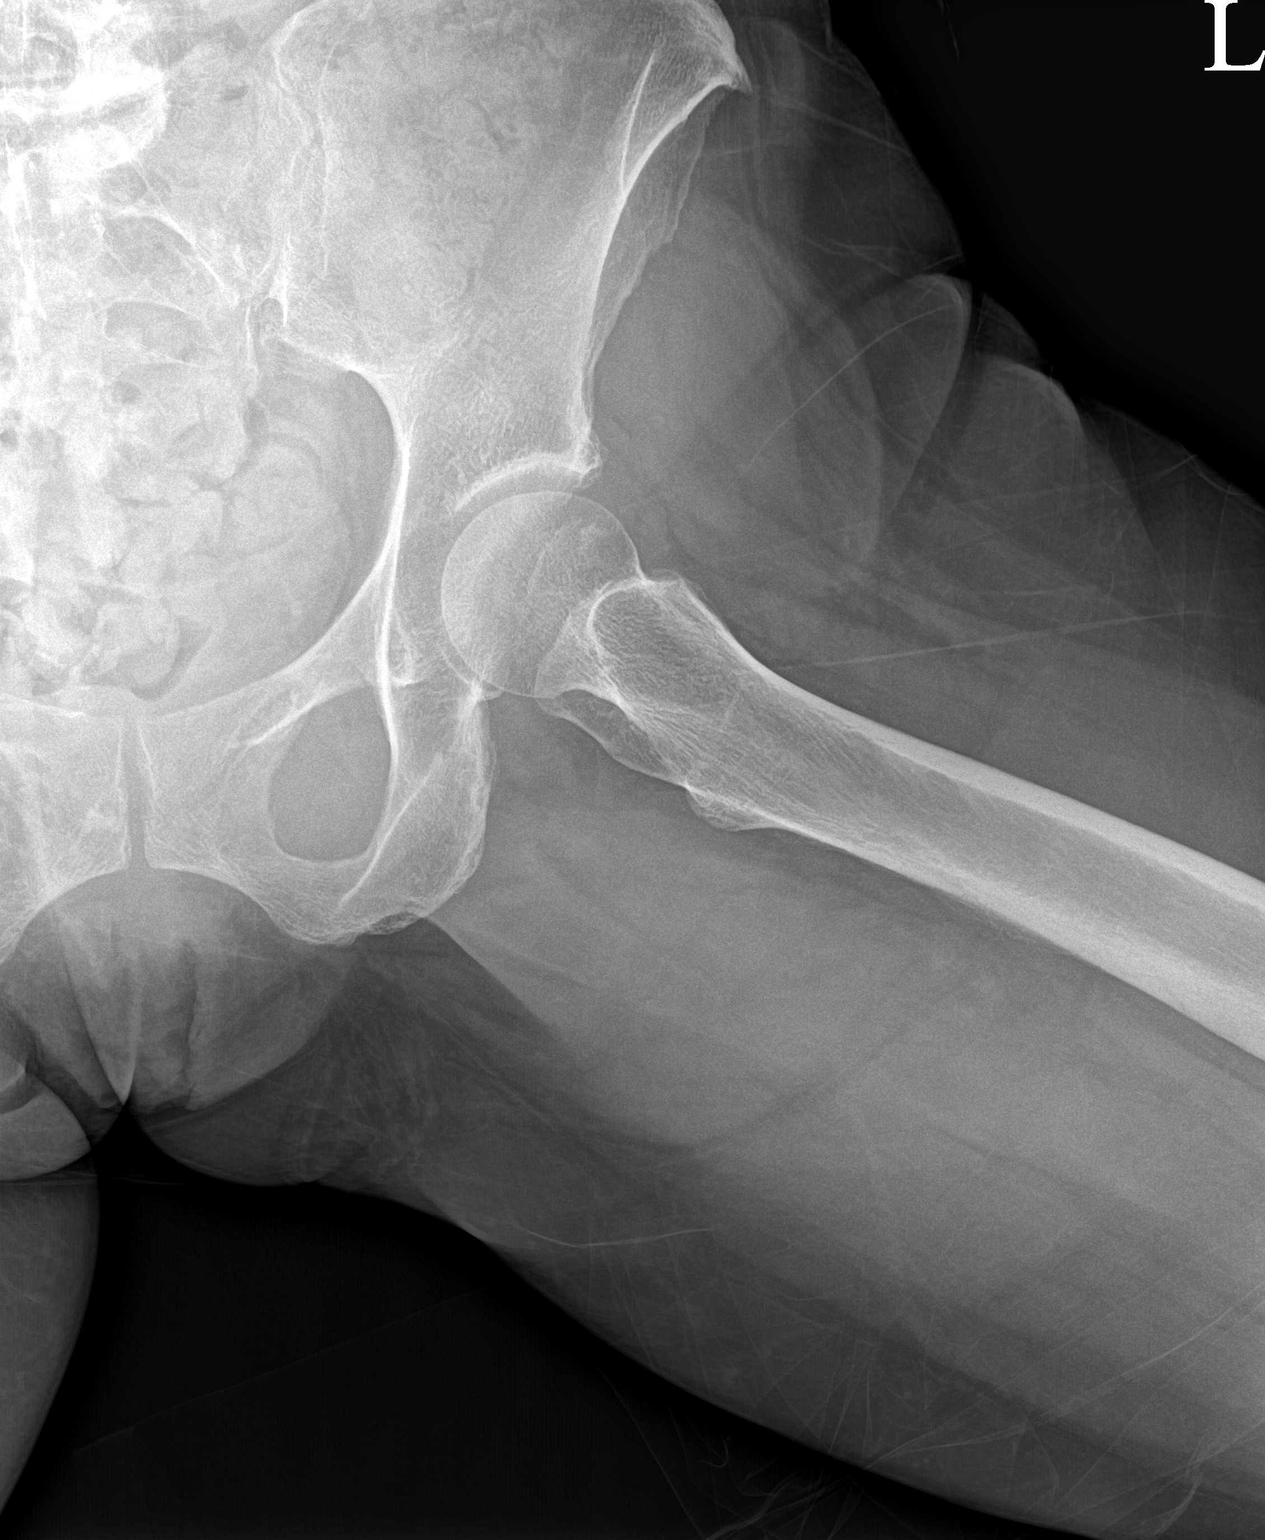

[3 of 3 positions shown; findings below may reference images not displayed]

FINDINGS: There is no evidence of hip fracture or dislocation. There is no
evidence of arthropathy or other focal bone abnormality.
IMPRESSION: Negative.

## 2021-10-15 MED ORDER — GABAPENTIN 100 MG PO CAPS: 200.0000 mg | ORAL_CAPSULE | Freq: Every day | ORAL | 0 refills | Status: DC

## 2021-10-15 NOTE — Assessment & Plan Note (Signed)
Patient is having suicidal thoughts, patient does have weakness noted of the left leg.  Intensive 4-5 strength of the dorsiflexion of the foot and patient does have atrophy of the gluteal muscles on the left side.  This is significantly concerning in this patient. ?Patient also has a history of CLL.  Patient has noticed that she is becoming more and more fatigued with increasing discomfort and pain as well.  At this point I would like to get a x-rays but did not feel that an MRI will be necessary.  Patient wants to continue to stay significantly active.  Follow-up with me again after imaging and we will discuss further treatment options such as epidurals.  Given gabapentin to help with some nighttime pain at the moment. ?

## 2021-10-15 NOTE — Patient Instructions (Addendum)
Xrays today ?Gabapentin '200mg'$  at night ?Lets see what the imaging shows ?[place] [phone] ?Call Today ? ?When we receive your results we will contact you. ? ? ?

## 2021-10-28 ENCOUNTER — Other Ambulatory Visit: Payer: Commercial Managed Care - PPO

## 2021-11-08 ENCOUNTER — Other Ambulatory Visit: Payer: Commercial Managed Care - PPO

## 2021-11-16 ENCOUNTER — Ambulatory Visit
Admission: RE | Admit: 2021-11-16 | Discharge: 2021-11-16 | Disposition: A | Discharge status: Home / Self Care | Payer: Commercial Managed Care - PPO | Source: Ambulatory Visit | Attending: Family Medicine | Admitting: Family Medicine

## 2021-11-16 DIAGNOSIS — G8929 Other chronic pain: Secondary | ICD-10-CM

## 2021-11-16 IMAGING — MR MR PELVIS W/O CM
4 of 5 series · 28 of 48 positions shown · non-contrast
Comparison: None.

CLINICAL DATA: Low back pain, left leg weakness, left hip pain.

EXAM:
MRI PELVIS WITHOUT CONTRAST
TECHNIQUE: Multiplanar multisequence MR imaging of the pelvis was performed. No
intravenous contrast was administered.

[Series 3: STIR · coronal · right · 3.0mm · 1.25mm/px · 11 of 43 slices shown]
[im 1/43]
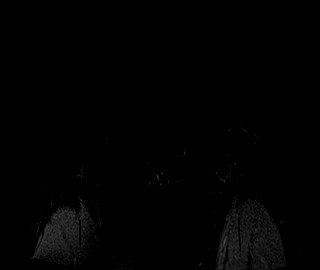
[im 5/43]
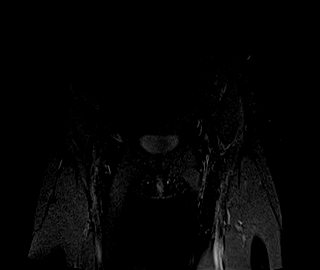
[im 9/43]
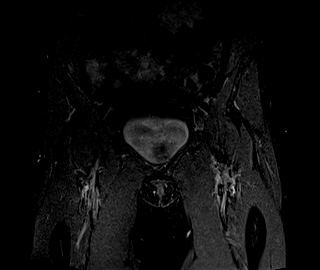
[im 13/43]
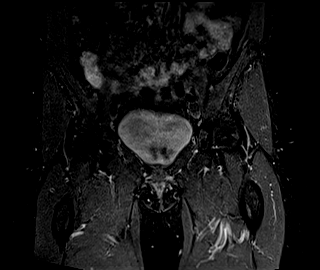
[im 17/43]
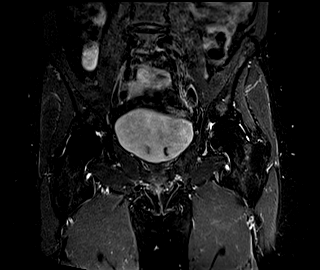
[im 22/43]
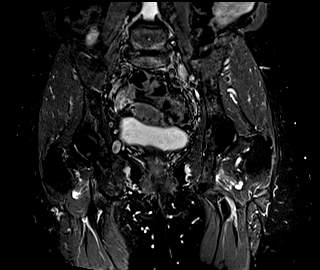
[im 26/43]
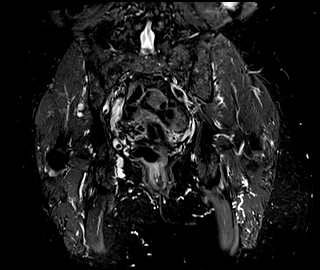
[im 30/43]
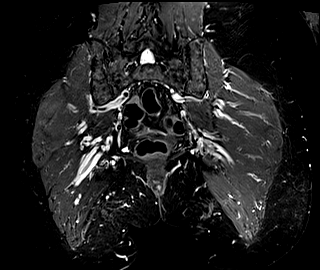
[im 34/43]
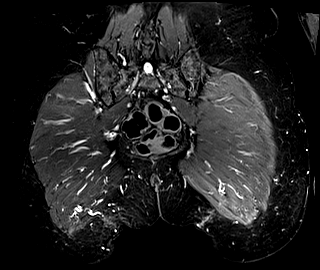
[im 38/43]
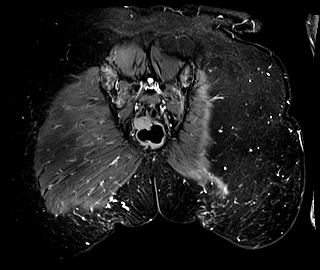
[im 43/43]
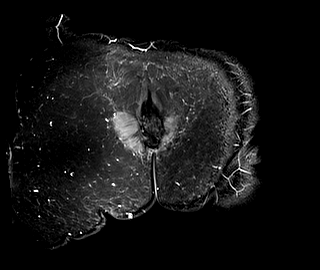

[Series 4: T1 · coronal · right · 3.0mm · 0.78mm/px · 11 of 43 slices shown (1 of 2)]
[im 1/43]
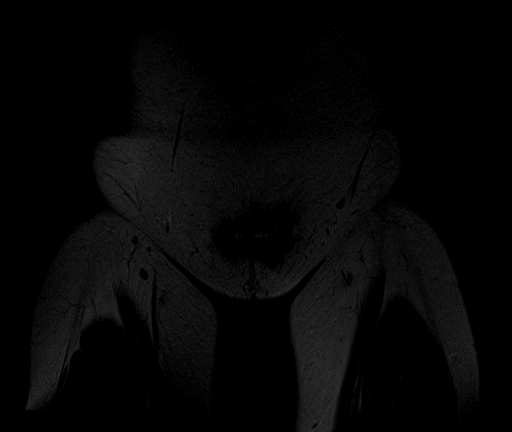
[im 5/43]
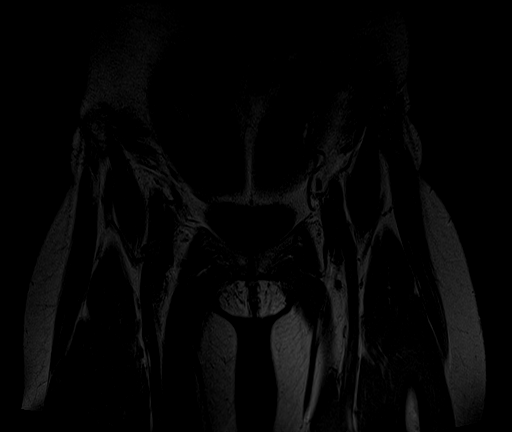
[im 9/43]
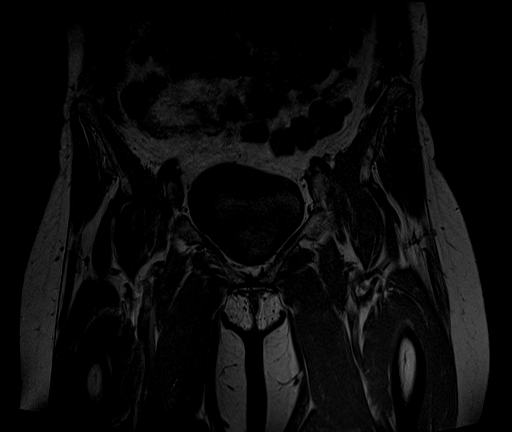
[im 13/43]
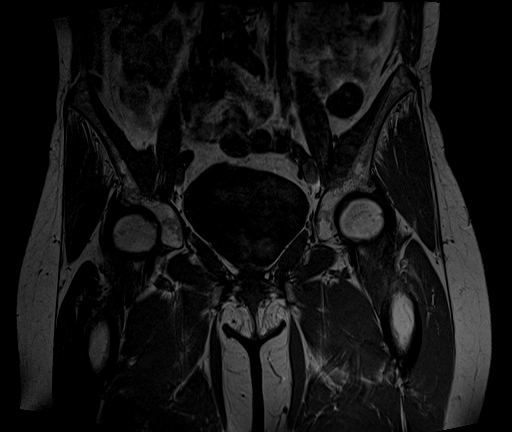
[im 17/43]
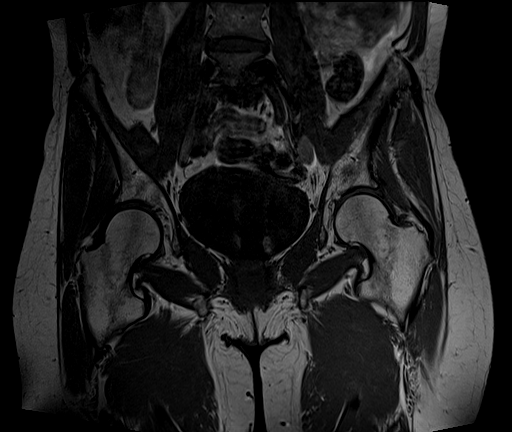
[im 22/43]
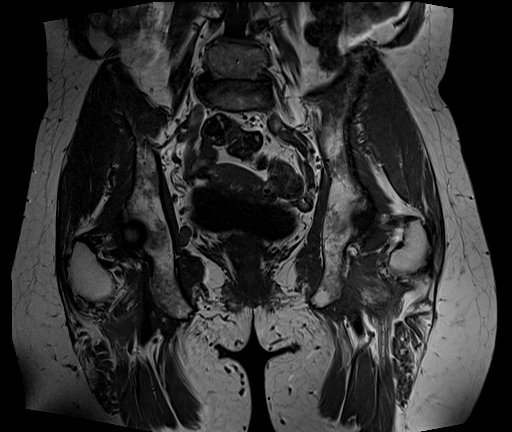
[im 26/43]
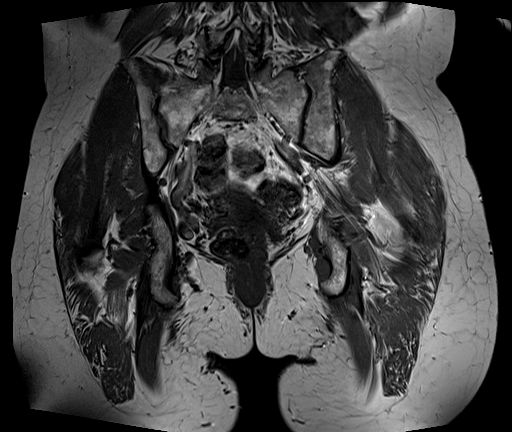
[im 30/43]
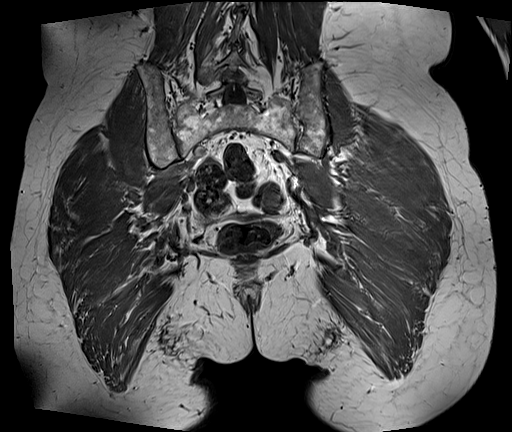
[im 34/43]
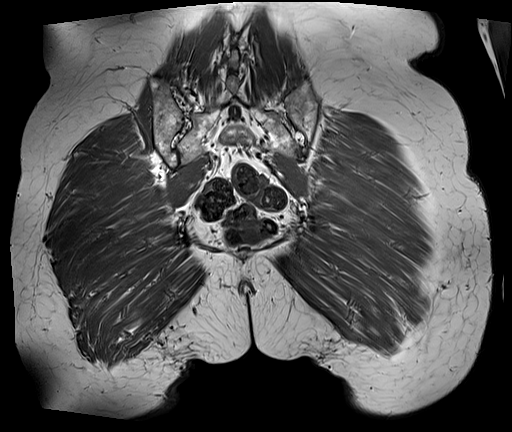
[im 38/43]
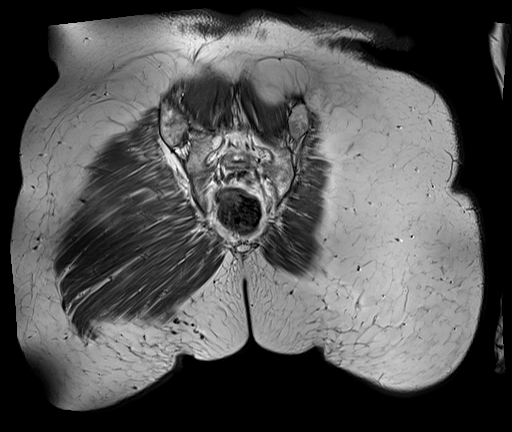
[im 43/43]
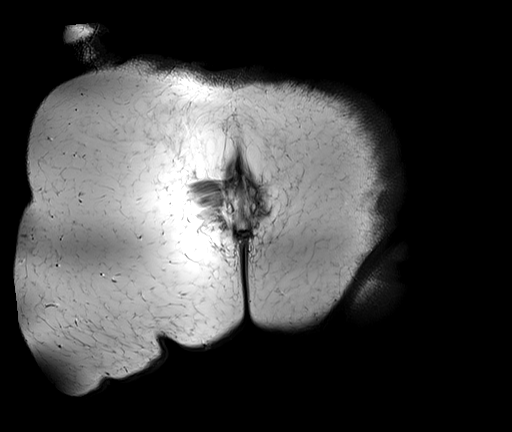

[Series 5: T1 · axial · right · 5.0mm · 0.78mm/px · z∈[-116,+45]mm · 3 of 33 slices shown (2 of 2)]
[im 5/33]
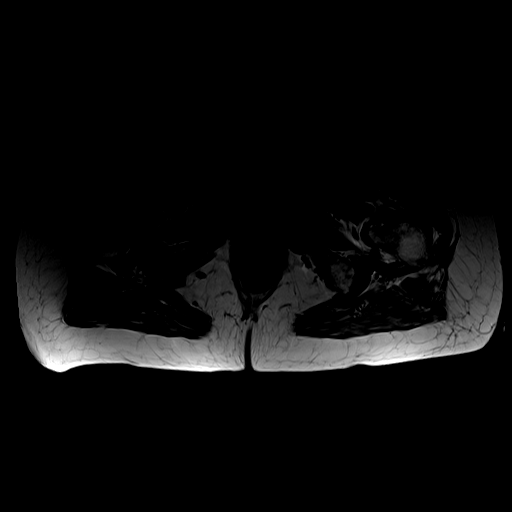
[im 19/33]
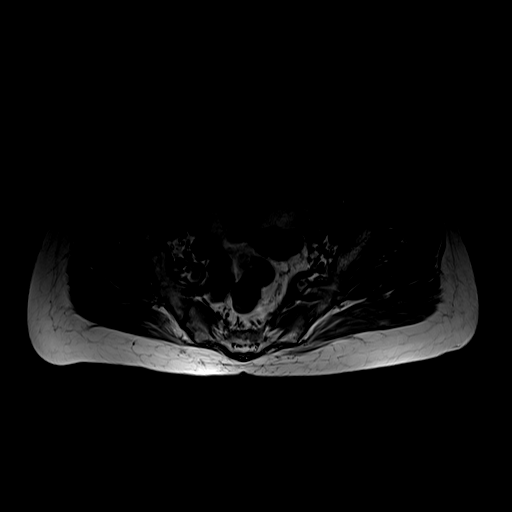
[im 28/33]
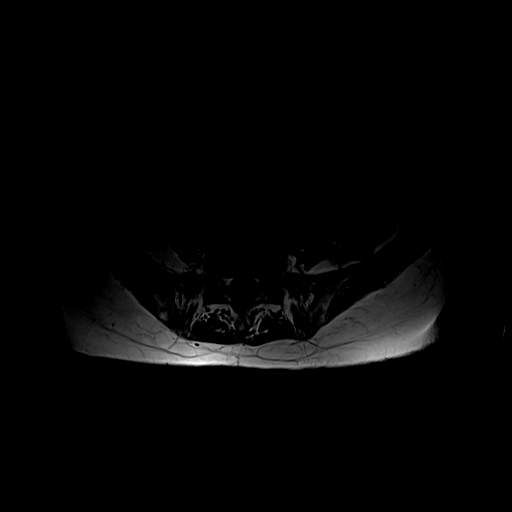

[Series 6: T2 fat-sat · axial · right · 5.0mm · 0.78mm/px · z∈[-105,+35]mm · 3 of 30 slices shown]
[im 5/30]
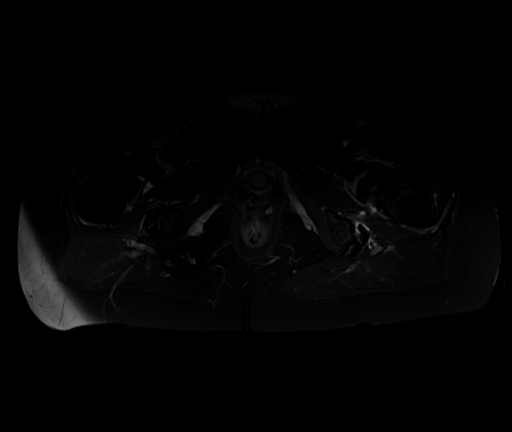
[im 15/30]
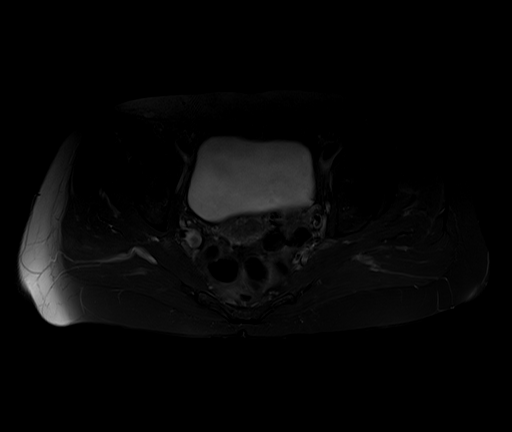
[im 25/30]
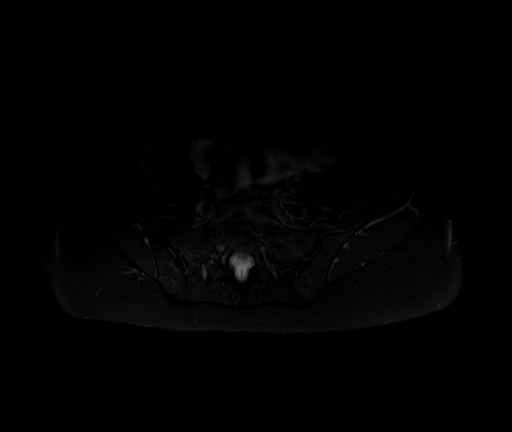

[28 of 48 positions shown; findings below may reference images not displayed]

FINDINGS: Bones:

No hip fracture, dislocation or avascular necrosis.

No periosteal reaction or bone destruction. No aggressive osseous
lesion.

Normal sacrum and sacroiliac joints. No SI joint widening or erosive
changes.

Degenerative disease with disc height loss at L5-S1. at L5-S1 there
is a broad-based disc bulge and mild bilateral facet arthropathy.

Articular cartilage and labrum

Articular cartilage:  No chondral defect.

Labrum: Grossly intact, but evaluation is limited by lack of
intraarticular fluid.

Joint or bursal effusion

Joint effusion:  No hip joint effusion.  No SI joint effusion.

Bursae:  Small amount of fluid in the right ischial femoral bursa.

Muscles and tendons

Flexors: Normal.

Extensors: Normal.

Abductors: Normal.

Adductors: Mild muscle edema in the right quadratus femoris muscle.

Gluteals: Normal.

Hamstrings: Mild tendinosis of the right hamstring origin. Mild
tendinosis of the left hamstring origin with a partial-thickness
tear.

Other findings

No pelvic free fluid. No fluid collection or hematoma. No inguinal
lymphadenopathy. No inguinal hernia.
IMPRESSION: 1. Mild tendinosis of the left hamstring origin with a
partial-thickness tear.
2. Mild tendinosis of the right hamstring origin.
3. No hip fracture, dislocation or avascular necrosis.
4. Mild right ischial femoral bursitis. Mild muscle edema in the
right quadratus femoris muscle as can be seen with ischio-femoral
impingement.
5. Degenerative disease with disc height loss at L5-S1. at L5-S1
there is a broad-based disc bulge and mild bilateral facet
arthropathy.

## 2021-11-16 IMAGING — MR MR LUMBAR SPINE W/O CM
4 of 5 series · 19 of 48 positions shown · non-contrast
Comparison: Radiography [DATE]

CLINICAL DATA: Low back pain with symptoms persisting over 6 weeks.
Pain is worse on the left and radiates to the hip and hamstring on
and off. History of CLL

EXAM:
MRI LUMBAR SPINE WITHOUT CONTRAST
TECHNIQUE: Multiplanar, multisequence MR imaging of the lumbar spine was
performed. No intravenous contrast was administered.

[Series 6: T2 · sagittal · 4.0mm · 0.73mm/px · 6 of 15 slices shown (1 of 2)]
[im 1/15]
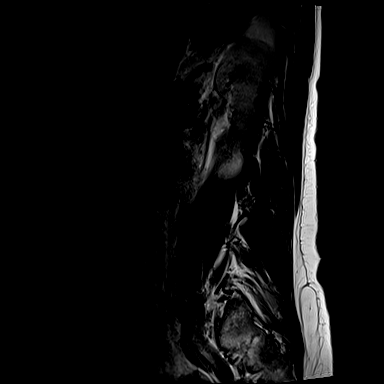
[im 3/15]
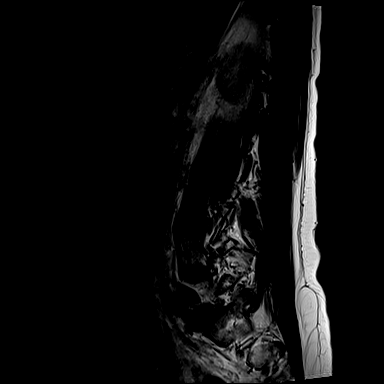
[im 6/15]
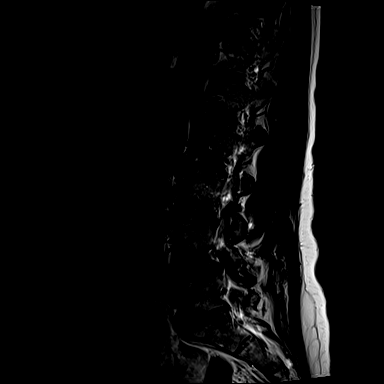
[im 9/15]
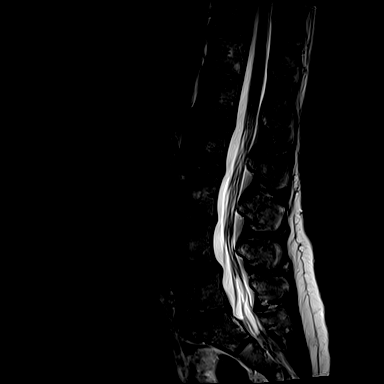
[im 12/15]
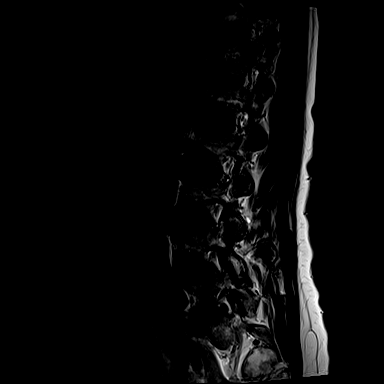
[im 15/15]
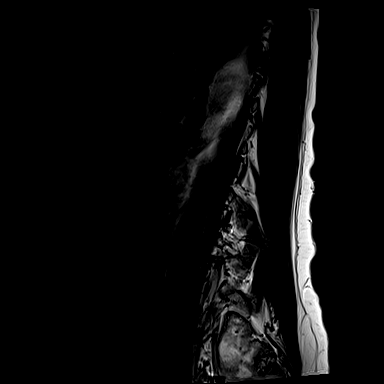

[Series 7: T1 · sagittal · 4.0mm · 0.73mm/px · 3 of 15 slices shown (1 of 2)]
[im 3/15]
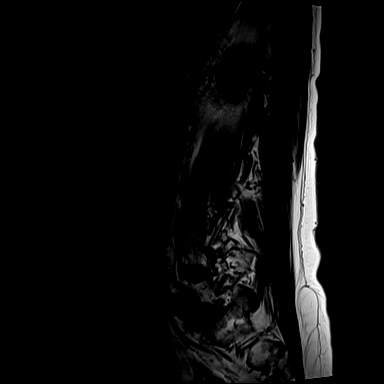
[im 9/15]
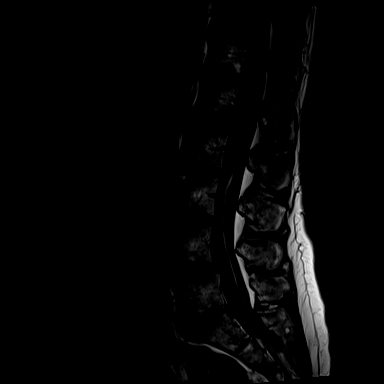
[im 15/15]
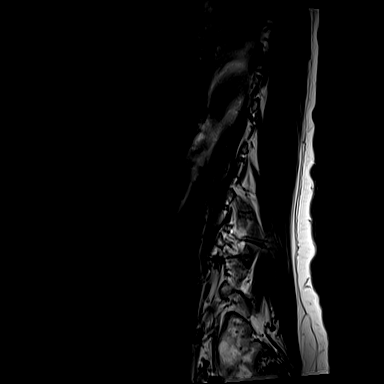

[Series 11: T1 · axial · 4.0mm · 0.28mm/px · z∈[+14,+172]mm · 3 of 39 slices shown (2 of 2)]
[im 6/39]
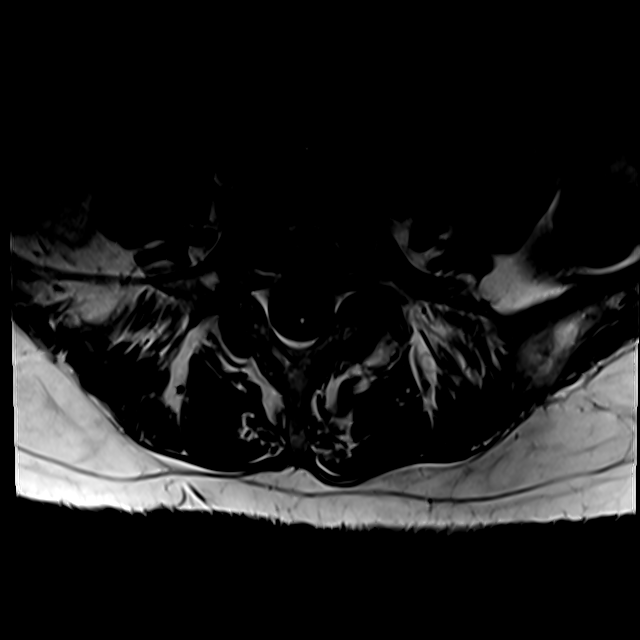
[im 20/39]
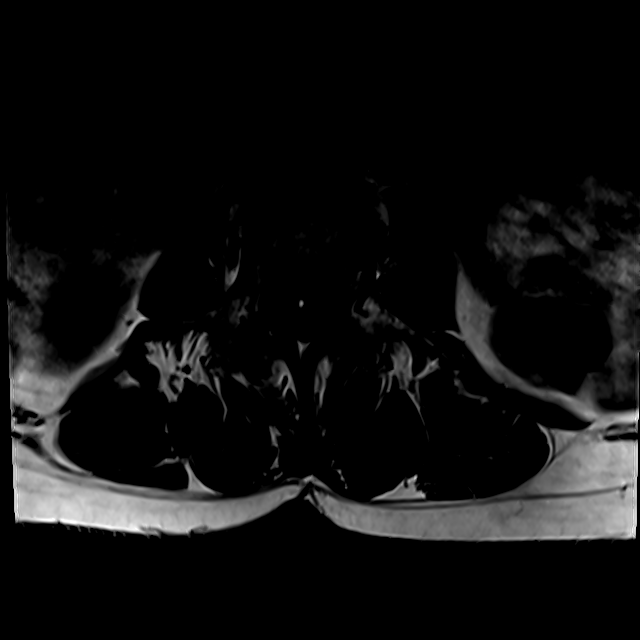
[im 33/39]
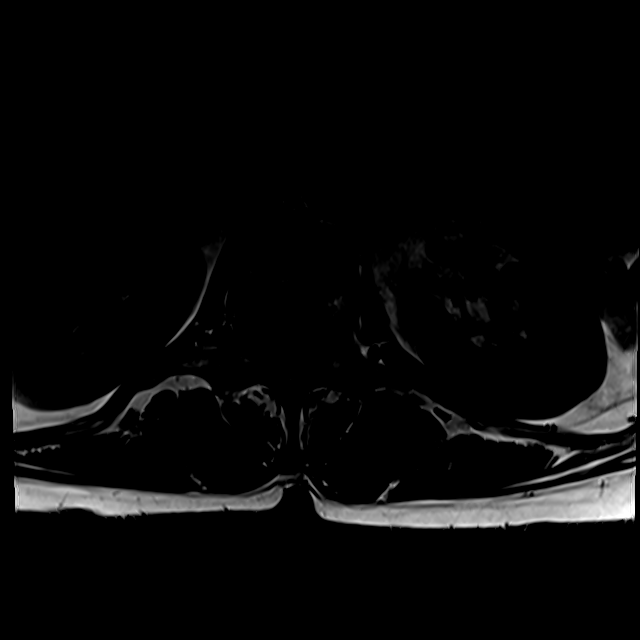

[Series 14: T2 · axial · 4.0mm · 0.28mm/px · z∈[-11,+172]mm · 7 of 39 slices shown (2 of 2)]
[im 1/39]
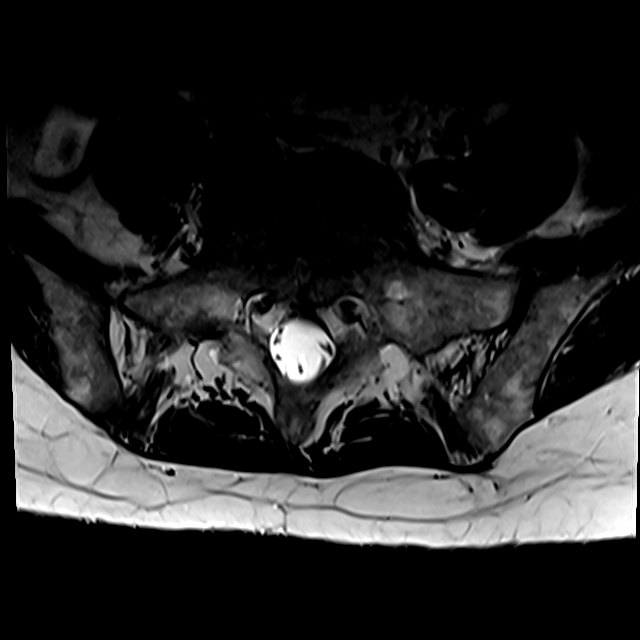
[im 6/39]
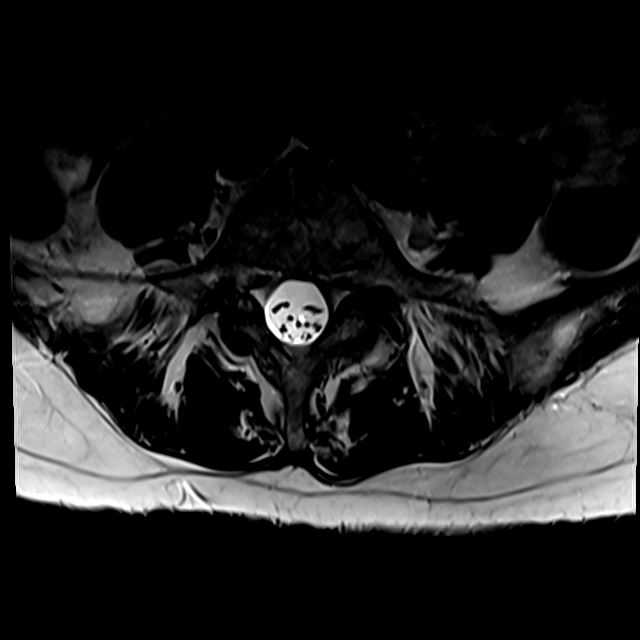
[im 11/39]
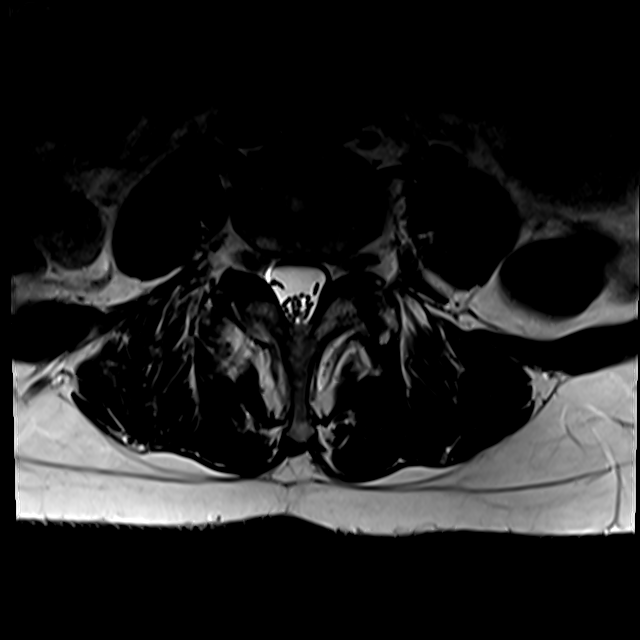
[im 17/39]
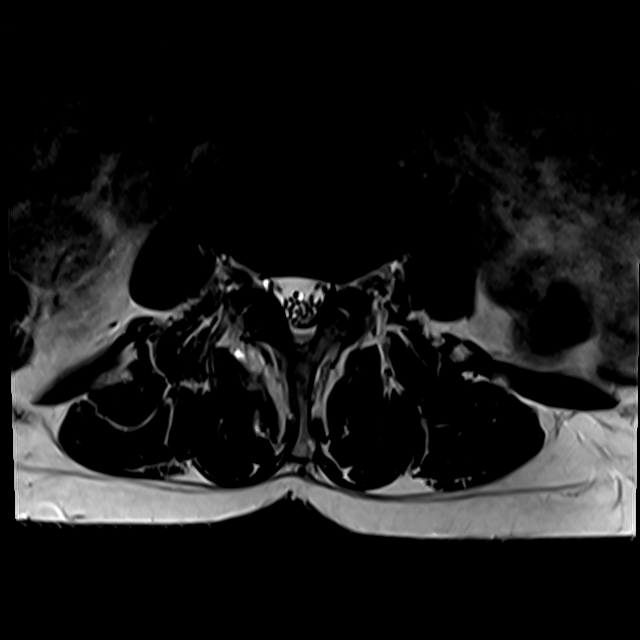
[im 20/39]
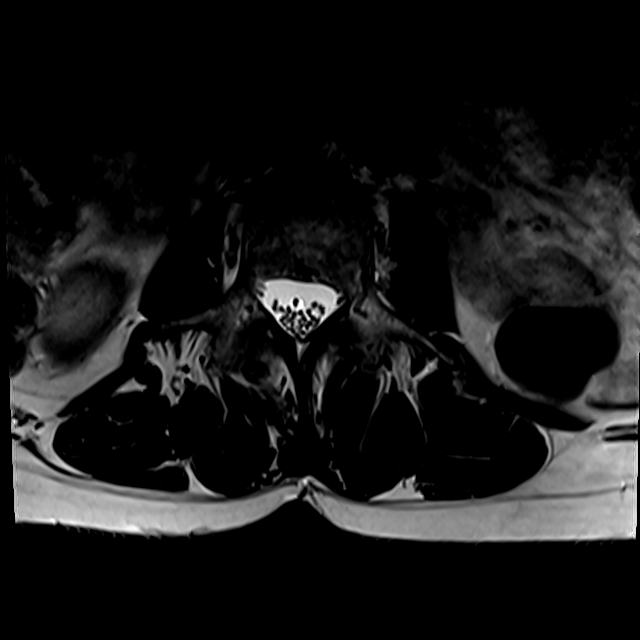
[im 22/39]
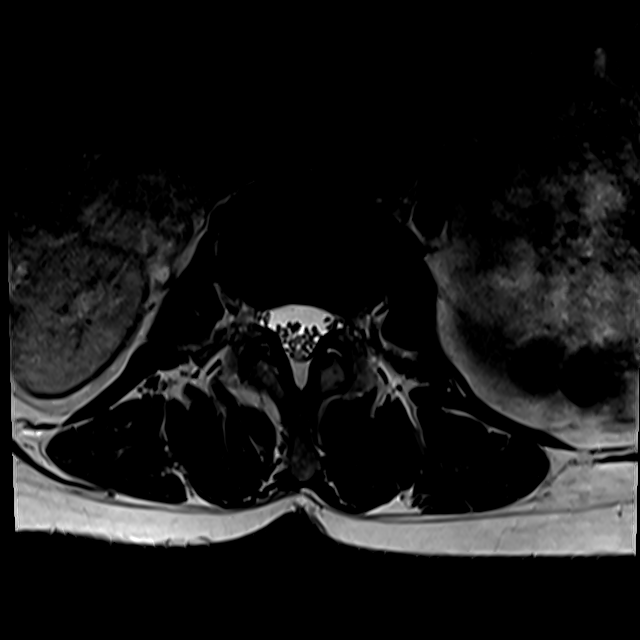
[im 33/39]
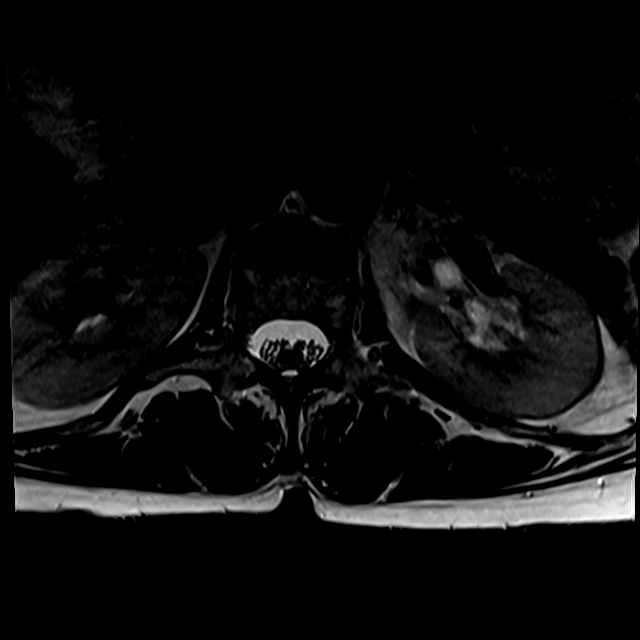

[19 of 48 positions shown; findings below may reference images not displayed]

FINDINGS: Segmentation:  Standard.

Alignment:  Degenerative grade 1 anterolisthesis at L4-5

Vertebrae:  No fracture, evidence of discitis, or bone lesion.

Conus medullaris and cauda equina: Conus extends to the L1 level.
Conus and cauda equina appear normal.

Paraspinal and other soft tissues: Negative for perispinal mass or
inflammation

Disc levels:

L4-L5: Facet osteoarthritis with spurring and anterolisthesis.
Ligamentum flavum thickening on both sides. The disc is narrowed
with biforaminal bulging. No neural compression

L5-S1:Disc collapse and endplate degeneration. Mild facet spurring.
No neural impingement
IMPRESSION: 1. L4-5 focal facet osteoarthritis with anterolisthesis but no
neural impingement.
2. L5-S1 focal advanced disc degeneration.

## 2021-11-20 ENCOUNTER — Other Ambulatory Visit: Payer: Self-pay | Admitting: Family Medicine

## 2021-11-20 ENCOUNTER — Telehealth: Payer: Self-pay | Admitting: Family Medicine

## 2021-11-20 DIAGNOSIS — G8929 Other chronic pain: Secondary | ICD-10-CM

## 2021-11-20 NOTE — Telephone Encounter (Signed)
Pt sent the following msg via [facility] ? ?I don't really need an appt-I am just trying to reach out to see if I can still run with the tear and the slipped disc? I suppose I can do the injections if you really think they will help- TY! ?

## 2021-11-20 NOTE — Telephone Encounter (Signed)
Can we order L4/5 facet injection please bilaterally  ?

## 2021-11-20 NOTE — Telephone Encounter (Signed)
Injection ordered. Will inform patient via [place] ?

## 2022-01-18 ENCOUNTER — Encounter: Payer: Self-pay | Admitting: Family Medicine

## 2022-02-01 ENCOUNTER — Ambulatory Visit
Admission: RE | Admit: 2022-02-01 | Discharge: 2022-02-01 | Disposition: A | Discharge status: Home / Self Care | Payer: Commercial Managed Care - PPO | Source: Ambulatory Visit | Attending: Family Medicine | Admitting: Family Medicine

## 2022-02-01 DIAGNOSIS — G8929 Other chronic pain: Secondary | ICD-10-CM

## 2022-02-01 MED ORDER — IOPAMIDOL (ISOVUE-M 200) INJECTION 41%
1.0000 mL | Freq: Once | INTRAMUSCULAR | Status: AC
  Administered 2022-02-01: 1 mL via INTRA_ARTICULAR

## 2022-02-01 MED ORDER — METHYLPREDNISOLONE ACETATE 40 MG/ML INJ SUSP (RADIOLOG
80.0000 mg | Freq: Once | INTRAMUSCULAR | Status: AC
  Administered 2022-02-01: 80 mg via INTRA_ARTICULAR

## 2022-02-01 NOTE — Discharge Instructions (Signed)

## 2022-02-13 ENCOUNTER — Telehealth: Payer: Self-pay | Admitting: Hematology

## 2022-02-13 NOTE — Telephone Encounter (Signed)
Left message with rescheduled upcoming appointment due to provider's template. 

## 2022-03-14 ENCOUNTER — Other Ambulatory Visit: Payer: Commercial Managed Care - PPO

## 2022-03-14 ENCOUNTER — Ambulatory Visit: Payer: Commercial Managed Care - PPO | Admitting: Hematology

## 2022-04-12 ENCOUNTER — Inpatient Hospital Stay: Payer: Commercial Managed Care - PPO

## 2022-04-12 ENCOUNTER — Inpatient Hospital Stay: Payer: Commercial Managed Care - PPO | Admitting: Hematology

## 2022-05-01 ENCOUNTER — Inpatient Hospital Stay: Payer: Commercial Managed Care - PPO | Admitting: Hematology

## 2022-05-01 ENCOUNTER — Inpatient Hospital Stay: Payer: Commercial Managed Care - PPO

## 2022-05-17 ENCOUNTER — Other Ambulatory Visit: Payer: Self-pay | Admitting: *Deleted

## 2022-05-17 DIAGNOSIS — C9110 Chronic lymphocytic leukemia of B-cell type not having achieved remission: Secondary | ICD-10-CM

## 2022-05-20 ENCOUNTER — Inpatient Hospital Stay: Payer: Commercial Managed Care - PPO | Attending: Hematology

## 2022-05-20 ENCOUNTER — Inpatient Hospital Stay (HOSPITAL_BASED_OUTPATIENT_CLINIC_OR_DEPARTMENT_OTHER): Payer: Commercial Managed Care - PPO | Admitting: Hematology

## 2022-05-20 ENCOUNTER — Other Ambulatory Visit: Payer: Self-pay

## 2022-05-20 VITALS — BP 120/83 | HR 61 | Temp 97.3°F | Resp 18 | Wt 136.3 lb

## 2022-05-20 DIAGNOSIS — C9110 Chronic lymphocytic leukemia of B-cell type not having achieved remission: Secondary | ICD-10-CM | POA: Diagnosis present

## 2022-05-20 LAB — CBC WITH DIFFERENTIAL (CANCER CENTER ONLY)
Abs Immature Granulocytes: 0.04 10*3/uL (ref 0.00–0.07)
Basophils Absolute: 0.1 10*3/uL (ref 0.0–0.1)
Basophils Relative: 0 %
Eosinophils Absolute: 0.2 10*3/uL (ref 0.0–0.5)
Eosinophils Relative: 1 %
HCT: 43.3 % (ref 36.0–46.0)
Hemoglobin: 14.7 g/dL (ref 12.0–15.0)
Immature Granulocytes: 0 %
Lymphocytes Relative: 89 %
Lymphs Abs: 31.1 10*3/uL — ABNORMAL HIGH (ref 0.7–4.0)
MCH: 31.4 pg (ref 26.0–34.0)
MCHC: 33.9 g/dL (ref 30.0–36.0)
MCV: 92.5 fL (ref 80.0–100.0)
Monocytes Absolute: 0.6 10*3/uL (ref 0.1–1.0)
Monocytes Relative: 2 %
Neutro Abs: 2.8 10*3/uL (ref 1.7–7.7)
Neutrophils Relative %: 8 %
Platelet Count: 285 10*3/uL (ref 150–400)
RBC: 4.68 MIL/uL (ref 3.87–5.11)
RDW: 12.4 % (ref 11.5–15.5)
Smear Review: NORMAL
WBC Count: 34.7 10*3/uL — ABNORMAL HIGH (ref 4.0–10.5)
nRBC: 0 % (ref 0.0–0.2)

## 2022-05-20 LAB — CMP (CANCER CENTER ONLY)
ALT: 14 U/L (ref 0–44)
AST: 19 U/L (ref 15–41)
Albumin: 4.6 g/dL (ref 3.5–5.0)
Alkaline Phosphatase: 65 U/L (ref 38–126)
Anion gap: 6 (ref 5–15)
BUN: 11 mg/dL (ref 6–20)
CO2: 29 mmol/L (ref 22–32)
Calcium: 9.8 mg/dL (ref 8.9–10.3)
Chloride: 106 mmol/L (ref 98–111)
Creatinine: 0.68 mg/dL (ref 0.44–1.00)
GFR, Estimated: >60 mL/min (ref >60)
Glucose, Bld: 68 mg/dL — ABNORMAL LOW (ref 70–99)
Potassium: 4.8 mmol/L (ref 3.5–5.1)
Sodium: 141 mmol/L (ref 135–145)
Total Bilirubin: 0.8 mg/dL (ref 0.3–1.2)
Total Protein: 7 g/dL (ref 6.5–8.1)

## 2022-05-20 LAB — LACTATE DEHYDROGENASE: LDH: 150 U/L (ref 98–192)

## 2022-05-20 NOTE — Progress Notes (Signed)
  HEMATOLOGY/ONCOLOGY CLINIC NOTE  Date of Service: 05/20/22    Patient Care Team: Wolters, Sharon, MD as PCP - General (Family Medicine)  CHIEF COMPLAINTS/PURPOSE OF CONSULTATION:  Follow-up for continued management of CLL  HISTORY OF PRESENTING ILLNESS:  Please see previous note for details on initial presentation.  Interval History:   Cheryl Sims is here for follow-up for continued evaluation and management of her CLL. She was last seen by me on 09/14/2021 and was doing well.  She reports that she has been doing well with no new symptoms. She complains of mild fatigue, which she states might be due to work stress. She has been taking multi-vitamin and has been exercising regularly with running and weight lifting. She is also reports that she discontinued gabapentin and Vitamin-D supplement.   She reports she has been following up with dermatologist.  She reports mild night sweat. She denies leg swelling, fever, infections, back pain, and abdominal pain, skin rashes/lesions.   She is scheduled for influenza vaccine and COVID-19 booster in the upcoming week.    MEDICAL HISTORY:  Past Medical History:  Diagnosis Date   Anxiety    H. pylori infection    IBS (irritable bowel syndrome)    Peptic ulcer due to Helicobacter pylori     SURGICAL HISTORY: No past surgical history on file.  SOCIAL HISTORY: Social History   Socioeconomic History   Marital status: Married    Spouse name: Not on file   Number of children: Not on file   Years of education: Not on file   Highest education level: Not on file  Occupational History   Not on file  Tobacco Use   Smoking status: Never   Smokeless tobacco: Never  Vaping Use   Vaping Use: Never used  Substance and Sexual Activity   Alcohol use: Yes   Drug use: Never   Sexual activity: Yes  Other Topics Concern   Not on file  Social History Narrative   Not on file   Social Determinants of Health   Financial Resource  Strain: Not on file  Food Insecurity: Not on file  Transportation Needs: Not on file  Physical Activity: Not on file  Stress: Not on file  Social Connections: Not on file  Intimate Partner Violence: Not on file    FAMILY HISTORY: Family History  Problem Relation Age of Onset   Alzheimer's disease Father    Thyroid cancer Brother     ALLERGIES:  has No Known Allergies.  MEDICATIONS:  Current Outpatient Medications  Medication Sig Dispense Refill   ALPRAZolam (XANAX) 0.5 MG tablet TK 1/2 TO 1 T PO TID PRN  0   escitalopram (LEXAPRO) 10 MG tablet Take 10 mg by mouth daily.     gabapentin (NEURONTIN) 100 MG capsule Take 2 capsules (200 mg total) by mouth at bedtime. 180 capsule 0   No current facility-administered medications for this visit.    REVIEW OF SYSTEMS:   10 Point review of Systems was done is negative except as noted above.  PHYSICAL EXAMINATION: ECOG FS:1 - Symptomatic but completely ambulatory  Vitals:   05/20/22 0934  BP: 120/83  Pulse: 61  Resp: 18  Temp: (!) 97.3 F (36.3 C)  SpO2: 100%     Wt Readings from Last 3 Encounters:  05/20/22 136 lb 5 oz (61.8 kg)  10/15/21 141 lb (64 kg)  09/14/21 143 lb 6.4 oz (65 kg)   Body mass index is 20.13 kg/m.     NAD GENERAL:alert, in no acute distress and comfortable SKIN: no acute rashes, no significant lesions EYES: conjunctiva are pink and non-injected, sclera anicteric OROPHARYNX: MMM, no exudates, no oropharyngeal erythema or ulceration NECK: supple, no JVD LYMPH:  no palpable lymphadenopathy in the cervical, axillary or inguinal regions LUNGS: clear to auscultation b/l with normal respiratory effort HEART: regular rate & rhythm ABDOMEN:  normoactive bowel sounds , non tender, not distended. Extremity: no pedal edema PSYCH: alert & oriented x 3 with fluent speech NEURO: no focal motor/sensory deficits  LABORATORY DATA:  I have reviewed the data as listed  .    Latest Ref Rng & Units 05/20/2022     8:45 AM 09/14/2021    8:50 AM 02/26/2021    8:17 AM  CBC  WBC 4.0 - 10.5 K/uL 34.7  35.2  32.0   Hemoglobin 12.0 - 15.0 g/dL 14.7  14.6  14.6   Hematocrit 36.0 - 46.0 % 43.3  43.4  43.6   Platelets 150 - 400 K/uL 285  297  267     CBC    Component Value Date/Time   WBC 34.7 (H) 05/20/2022 0845   WBC 35.2 (H) 09/14/2021 0850   RBC 4.68 05/20/2022 0845   HGB 14.7 05/20/2022 0845   HCT 43.3 05/20/2022 0845   PLT 285 05/20/2022 0845   MCV 92.5 05/20/2022 0845   MCH 31.4 05/20/2022 0845   MCHC 33.9 05/20/2022 0845   RDW 12.4 05/20/2022 0845   LYMPHSABS PENDING 05/20/2022 0845   MONOABS PENDING 05/20/2022 0845   EOSABS PENDING 05/20/2022 0845   BASOSABS PENDING 05/20/2022 0845    .    Latest Ref Rng & Units 05/20/2022    8:45 AM 09/14/2021    8:50 AM 02/26/2021    8:17 AM  CMP  Glucose 70 - 99 mg/dL 68  93  74   BUN 6 - 20 mg/dL 11  11  12   Creatinine 0.44 - 1.00 mg/dL 0.68  0.67  0.71   Sodium 135 - 145 mmol/L 141  140  142   Potassium 3.5 - 5.1 mmol/L 4.8  4.4  4.5   Chloride 98 - 111 mmol/L 106  106  107   CO2 22 - 32 mmol/L 29  28  25   Calcium 8.9 - 10.3 mg/dL 9.8  9.5  9.7   Total Protein 6.5 - 8.1 g/dL 7.0  7.0  6.9   Total Bilirubin 0.3 - 1.2 mg/dL 0.8  0.7  0.8   Alkaline Phos 38 - 126 U/L 65  59  66   AST 15 - 41 U/L 19  16  16   ALT 0 - 44 U/L 14  10  10    . Lab Results  Component Value Date   LDH 150 05/20/2022     11/03/17 Peripheral Blood Flow Cytometry:   11/03/17 FISH, CLL Prognostic Panel:   RADIOGRAPHIC STUDIES: I have personally reviewed the radiological images as listed and agreed with the findings in the report. No results found.  ASSESSMENT & PLAN:   61 y.o. female with  1. Chronic lymphocytic leukemia Rai stage 0 11/03/17 Peripheral blood flow cytometry positive for monoclonal lymphoid population comprising 83% of lymphoid events, consistent with CLL.  11/03/17 CLL FISH panel negative for trisomy 12, p53 deletion or amplification,  and ATM deletion. Positive for [id number] (36% with bi-allelic deletion) 11/13/17 CT N/C/A/P indicated increased number without pathological enlargement of LN in the neck, no pathological lymphadenopathy in the   chest, abdomen of pelvis. No splenomegaly.   PLAN: -Patient notes no new constitutional symptoms or other symptoms of focal CLL progression at this time. -Lab showed elevated WBC of 34.7 k, nut stable. Hemoglobin of 14.7k, and platelets of 285. -Patient has no clinical symptoms or lab changes suggesting need for initiation of CLL treatment at this time. -She will continue active surveillance. -Recommended to start Vitamin-D 2000 supplement. -We will continue to monitor counts clinical examination in 6 months.   FOLLOW UP: RTC with Dr Kale with labs in 6 months.  The total time spent in the appointment was 21 minutes* .  All of the patient's questions were answered with apparent satisfaction. The patient knows to call the clinic with any problems, questions or concerns.   I,Param Shah,acting as a scribe for Gautam Kale, MD.,have documented all relevant documentation on the behalf of Gautam Kale, MD,as directed by  Gautam Kale, MD while in the presence of Gautam Kale, MD.  Gautam Kale MD MS AAHIVMS [facility] Hematology/Oncology Physician [facility]  .*Total Encounter Time as defined by the Centers for Medicare and Medicaid Services includes, in addition to the face-to-face time of a patient visit (documented in the note above) non-face-to-face time: obtaining and reviewing outside history, ordering and reviewing medications, tests or procedures, care coordination (communications with other health care professionals or caregivers) and documentation in the medical record.  

## 2022-07-19 ENCOUNTER — Other Ambulatory Visit: Payer: Self-pay | Admitting: Obstetrics and Gynecology

## 2022-07-19 DIAGNOSIS — E2839 Other primary ovarian failure: Secondary | ICD-10-CM

## 2022-11-19 ENCOUNTER — Telehealth: Payer: Self-pay | Admitting: Hematology

## 2022-12-09 ENCOUNTER — Other Ambulatory Visit: Payer: Commercial Managed Care - PPO

## 2022-12-09 ENCOUNTER — Ambulatory Visit: Payer: Commercial Managed Care - PPO | Admitting: Hematology

## 2022-12-12 ENCOUNTER — Other Ambulatory Visit: Payer: Self-pay

## 2022-12-12 DIAGNOSIS — C9110 Chronic lymphocytic leukemia of B-cell type not having achieved remission: Secondary | ICD-10-CM

## 2022-12-18 ENCOUNTER — Other Ambulatory Visit: Payer: Self-pay

## 2022-12-18 ENCOUNTER — Inpatient Hospital Stay (HOSPITAL_BASED_OUTPATIENT_CLINIC_OR_DEPARTMENT_OTHER): Payer: Commercial Managed Care - PPO | Admitting: Hematology

## 2022-12-18 ENCOUNTER — Inpatient Hospital Stay: Payer: Commercial Managed Care - PPO | Attending: Hematology

## 2022-12-18 VITALS — BP 119/71 | HR 58 | Temp 98.0°F | Resp 18 | Ht 69.0 in | Wt 140.8 lb

## 2022-12-18 DIAGNOSIS — C9110 Chronic lymphocytic leukemia of B-cell type not having achieved remission: Secondary | ICD-10-CM | POA: Insufficient documentation

## 2022-12-18 LAB — CMP (CANCER CENTER ONLY)
ALT: 12 U/L (ref 0–44)
AST: 20 U/L (ref 15–41)
Albumin: 4.6 g/dL (ref 3.5–5.0)
Alkaline Phosphatase: 62 U/L (ref 38–126)
Anion gap: 6 (ref 5–15)
BUN: 13 mg/dL (ref 6–20)
CO2: 30 mmol/L (ref 22–32)
Calcium: 9.3 mg/dL (ref 8.9–10.3)
Chloride: 105 mmol/L (ref 98–111)
Creatinine: 0.68 mg/dL (ref 0.44–1.00)
GFR, Estimated: >60 mL/min (ref >60)
Glucose, Bld: 93 mg/dL (ref 70–99)
Potassium: 3.9 mmol/L (ref 3.5–5.1)
Sodium: 141 mmol/L (ref 135–145)
Total Bilirubin: 0.4 mg/dL (ref 0.3–1.2)
Total Protein: 6.7 g/dL (ref 6.5–8.1)

## 2022-12-18 LAB — CBC WITH DIFFERENTIAL (CANCER CENTER ONLY)
Abs Immature Granulocytes: 0.05 10*3/uL (ref 0.00–0.07)
Basophils Absolute: 0.1 10*3/uL (ref 0.0–0.1)
Basophils Relative: 0 %
Eosinophils Absolute: 0.2 10*3/uL (ref 0.0–0.5)
Eosinophils Relative: 1 %
HCT: 41.9 % (ref 36.0–46.0)
Hemoglobin: 13.9 g/dL (ref 12.0–15.0)
Immature Granulocytes: 0 %
Lymphocytes Relative: 87 %
Lymphs Abs: 29.5 10*3/uL — ABNORMAL HIGH (ref 0.7–4.0)
MCH: 30.9 pg (ref 26.0–34.0)
MCHC: 33.2 g/dL (ref 30.0–36.0)
MCV: 93.1 fL (ref 80.0–100.0)
Monocytes Absolute: 0.7 10*3/uL (ref 0.1–1.0)
Monocytes Relative: 2 %
Neutro Abs: 3.4 10*3/uL (ref 1.7–7.7)
Neutrophils Relative %: 10 %
Platelet Count: 258 10*3/uL (ref 150–400)
RBC: 4.5 MIL/uL (ref 3.87–5.11)
RDW: 12.9 % (ref 11.5–15.5)
Smear Review: NORMAL
WBC Count: 33.9 10*3/uL — ABNORMAL HIGH (ref 4.0–10.5)
nRBC: 0 % (ref 0.0–0.2)

## 2022-12-18 LAB — LACTATE DEHYDROGENASE: LDH: 158 U/L (ref 98–192)

## 2022-12-18 NOTE — Progress Notes (Signed)
HEMATOLOGY/ONCOLOGY CLINIC NOTE  Date of Service: 12/18/22   Patient Care Team: Mila Palmer, MD as PCP - General (Family Medicine)  CHIEF COMPLAINTS/PURPOSE OF CONSULTATION:  Follow-up for continued management of CLL  HISTORY OF PRESENTING ILLNESS:  Please see previous note for details on initial presentation.  Interval History:   Cheryl Sims is here for follow-up for continued evaluation and management of her CLL. Patient was last seen by me on 05/20/2022 and reported mild fatigue and mild night sweats.  Today, she reports that she has been feeling well overall. She does complain of frequent intense drenching night sweats which began 2-3 months ago. Her episodes occur a couple times a week generally during the end of the day. The episodes are short-lived.   Patient does regularly take a multivitamin and collagen on a regular basis. She continues to take Lexapro at the same dose, but is working towards weaning off Lexapro 10 MG.  She denies any unexplained fever, chills, hot flashes, muscle pains, or new medications. She endorses normal p.o intake and normal sleeping habits.   MEDICAL HISTORY:  Past Medical History:  Diagnosis Date   Anxiety    H. pylori infection    IBS (irritable bowel syndrome)    Peptic ulcer due to Helicobacter pylori     SURGICAL HISTORY: No past surgical history on file.  SOCIAL HISTORY: Social History   Socioeconomic History   Marital status: Married    Spouse name: Not on file   Number of children: Not on file   Years of education: Not on file   Highest education level: Not on file  Occupational History   Not on file  Tobacco Use   Smoking status: Never   Smokeless tobacco: Never  Vaping Use   Vaping Use: Never used  Substance and Sexual Activity   Alcohol use: Yes   Drug use: Never   Sexual activity: Yes  Other Topics Concern   Not on file  Social History Narrative   Not on file   Social Determinants of Health    Financial Resource Strain: Not on file  Food Insecurity: Not on file  Transportation Needs: Not on file  Physical Activity: Not on file  Stress: Not on file  Social Connections: Not on file  Intimate Partner Violence: Not on file    FAMILY HISTORY: Family History  Problem Relation Age of Onset   Alzheimer's disease Father    Thyroid cancer Brother     ALLERGIES:  has No Known Allergies.  MEDICATIONS:  Current Outpatient Medications  Medication Sig Dispense Refill   ALPRAZolam (XANAX) 0.5 MG tablet TK 1/2 TO 1 T PO TID PRN  0   escitalopram (LEXAPRO) 10 MG tablet Take 10 mg by mouth daily.     No current facility-administered medications for this visit.    REVIEW OF SYSTEMS:    10 Point review of Systems was done is negative except as noted above.   PHYSICAL EXAMINATION: ECOG FS:1 - Symptomatic but completely ambulatory  Vitals:   12/18/22 1513  BP: 119/71  Pulse: (!) 58  Resp: 18  Temp: 98 F (36.7 C)  SpO2: 100%     Wt Readings from Last 3 Encounters:  12/18/22 140 lb 12.8 oz (63.9 kg)  05/20/22 136 lb 5 oz (61.8 kg)  10/15/21 141 lb (64 kg)   Body mass index is 20.79 kg/m.     GENERAL:alert, in no acute distress and comfortable SKIN: no acute rashes, no significant  lesions EYES: conjunctiva are pink and non-injected, sclera anicteric OROPHARYNX: MMM, no exudates, no oropharyngeal erythema or ulceration NECK: supple, no JVD LYMPH:  no palpable lymphadenopathy in the cervical, axillary or inguinal regions LUNGS: clear to auscultation b/l with normal respiratory effort HEART: regular rate & rhythm ABDOMEN:  normoactive bowel sounds , non tender, not distended. Extremity: no pedal edema PSYCH: alert & oriented x 3 with fluent speech NEURO: no focal motor/sensory deficits   LABORATORY DATA:  I have reviewed the data as listed  .    Latest Ref Rng & Units 12/18/2022    2:46 PM 05/20/2022    8:45 AM 09/14/2021    8:50 AM  CBC  WBC 4.0 - 10.5  K/uL 33.9  34.7  35.2   Hemoglobin 12.0 - 15.0 g/dL 16.1  09.6  04.5   Hematocrit 36.0 - 46.0 % 41.9  43.3  43.4   Platelets 150 - 400 K/uL 258  285  297     CBC    Component Value Date/Time   WBC 33.9 (H) 12/18/2022 1446   WBC 35.2 (H) 09/14/2021 0850   RBC 4.50 12/18/2022 1446   HGB 13.9 12/18/2022 1446   HCT 41.9 12/18/2022 1446   PLT 258 12/18/2022 1446   MCV 93.1 12/18/2022 1446   MCH 30.9 12/18/2022 1446   MCHC 33.2 12/18/2022 1446   RDW 12.9 12/18/2022 1446   LYMPHSABS 29.5 (H) 12/18/2022 1446   MONOABS 0.7 12/18/2022 1446   EOSABS 0.2 12/18/2022 1446   BASOSABS 0.1 12/18/2022 1446    .    Latest Ref Rng & Units 05/20/2022    8:45 AM 09/14/2021    8:50 AM 02/26/2021    8:17 AM  CMP  Glucose 70 - 99 mg/dL 68  93  74   BUN 6 - 20 mg/dL 11  11  12    Creatinine 0.44 - 1.00 mg/dL 4.09  8.11  9.14   Sodium 135 - 145 mmol/L 141  140  142   Potassium 3.5 - 5.1 mmol/L 4.8  4.4  4.5   Chloride 98 - 111 mmol/L 106  106  107   CO2 22 - 32 mmol/L 29  28  25    Calcium 8.9 - 10.3 mg/dL 9.8  9.5  9.7   Total Protein 6.5 - 8.1 g/dL 7.0  7.0  6.9   Total Bilirubin 0.3 - 1.2 mg/dL 0.8  0.7  0.8   Alkaline Phos 38 - 126 U/L 65  59  66   AST 15 - 41 U/L 19  16  16    ALT 0 - 44 U/L 14  10  10     . Lab Results  Component Value Date   LDH 158 12/18/2022     11/03/17 Peripheral Blood Flow Cytometry:   11/03/17 FISH, CLL Prognostic Panel:   RADIOGRAPHIC STUDIES: I have personally reviewed the radiological images as listed and agreed with the findings in the report. No results found.  ASSESSMENT & PLAN:   62 y.o. female with  1. Chronic lymphocytic leukemia Rai stage 0 11/03/17 Peripheral blood flow cytometry positive for monoclonal lymphoid population comprising 83% of lymphoid events, consistent with CLL.  11/03/17 CLL FISH panel negative for trisomy 12, p53 deletion or amplification, and ATM deletion. Positive for [id number] (36% with bi-allelic deletion) 11/13/17 CT N/C/A/P  indicated increased number without pathological enlargement of LN in the neck, no pathological lymphadenopathy in the chest, abdomen of pelvis. No splenomegaly.   PLAN:  -Discussed lab  results on 12/18/2022 in detail with patient. CBC stable, showed WBC of 33.9K, hemoglobin of 13.9, and platelets of 258K.  Cmp wnl LDH WNL -Patient notes no new constitutional symptoms or other symptoms of CLL progression at this time.  -Patient has no clinical symptoms or lab changes suggesting need for initiation of CLL treatment at this time.  -She will continue active surveillance.  -recommend patient to continue to consume a well-balanced diet and regularly engage in physical activity -Will alternate visits with PCP to monitor labs every 6 months   FOLLOW UP: RTC with Dr Candise Che with labs in 6 months.  The total time spent in the appointment was 20 minutes* .  All of the patient's questions were answered with apparent satisfaction. The patient knows to call the clinic with any problems, questions or concerns.   Wyvonnia Lora MD MS AAHIVMS [facility] Hematology/Oncology Physician [facility]  .*Total Encounter Time as defined by the Centers for Medicare and Medicaid Services includes, in addition to the face-to-face time of a patient visit (documented in the note above) non-face-to-face time: obtaining and reviewing outside history, ordering and reviewing medications, tests or procedures, care coordination (communications with other health care professionals or caregivers) and documentation in the medical record.    I,Mitra Faeizi,acting as a Neurosurgeon for Wyvonnia Lora, MD.,have documented all relevant documentation on the behalf of Wyvonnia Lora, MD,as directed by  Wyvonnia Lora, MD while in the presence of Wyvonnia Lora, MD.  .I have reviewed the above documentation for accuracy and completeness, and I agree with the above. Johney Maine MD

## 2022-12-19 ENCOUNTER — Telehealth: Payer: Self-pay | Admitting: Hematology

## 2023-01-20 ENCOUNTER — Ambulatory Visit
Admission: RE | Admit: 2023-01-20 | Discharge: 2023-01-20 | Disposition: A | Discharge status: Home / Self Care | Payer: Commercial Managed Care - PPO | Source: Ambulatory Visit | Attending: Obstetrics and Gynecology | Admitting: Obstetrics and Gynecology

## 2023-01-20 DIAGNOSIS — E2839 Other primary ovarian failure: Secondary | ICD-10-CM

## 2023-06-17 ENCOUNTER — Other Ambulatory Visit: Payer: Self-pay

## 2023-06-17 DIAGNOSIS — C9110 Chronic lymphocytic leukemia of B-cell type not having achieved remission: Secondary | ICD-10-CM

## 2023-06-18 ENCOUNTER — Inpatient Hospital Stay: Payer: Commercial Managed Care - PPO | Attending: Internal Medicine

## 2023-06-18 ENCOUNTER — Inpatient Hospital Stay (HOSPITAL_BASED_OUTPATIENT_CLINIC_OR_DEPARTMENT_OTHER): Payer: Commercial Managed Care - PPO | Admitting: Hematology

## 2023-06-18 VITALS — BP 121/73 | HR 67 | Temp 97.2°F | Resp 18 | Wt 144.9 lb

## 2023-06-18 DIAGNOSIS — Z79899 Other long term (current) drug therapy: Secondary | ICD-10-CM | POA: Diagnosis not present

## 2023-06-18 DIAGNOSIS — C9110 Chronic lymphocytic leukemia of B-cell type not having achieved remission: Secondary | ICD-10-CM

## 2023-06-18 LAB — CMP (CANCER CENTER ONLY)
ALT: 16 U/L (ref 0–44)
AST: 23 U/L (ref 15–41)
Albumin: 4.5 g/dL (ref 3.5–5.0)
Alkaline Phosphatase: 66 U/L (ref 38–126)
Anion gap: 6 (ref 5–15)
BUN: 9 mg/dL (ref 6–20)
CO2: 30 mmol/L (ref 22–32)
Calcium: 9.5 mg/dL (ref 8.9–10.3)
Chloride: 105 mmol/L (ref 98–111)
Creatinine: 0.63 mg/dL (ref 0.44–1.00)
GFR, Estimated: >60 mL/min (ref >60)
Glucose, Bld: 97 mg/dL (ref 70–99)
Potassium: 4.1 mmol/L (ref 3.5–5.1)
Sodium: 141 mmol/L (ref 135–145)
Total Bilirubin: 0.5 mg/dL (ref <1.2)
Total Protein: 6.8 g/dL (ref 6.5–8.1)

## 2023-06-18 LAB — CBC WITH DIFFERENTIAL (CANCER CENTER ONLY)
Abs Immature Granulocytes: 0.05 10*3/uL (ref 0.00–0.07)
Basophils Absolute: 0.1 10*3/uL (ref 0.0–0.1)
Basophils Relative: 0 %
Eosinophils Absolute: 0.2 10*3/uL (ref 0.0–0.5)
Eosinophils Relative: 0 %
HCT: 43.1 % (ref 36.0–46.0)
Hemoglobin: 14.6 g/dL (ref 12.0–15.0)
Immature Granulocytes: 0 %
Lymphocytes Relative: 88 %
Lymphs Abs: 33.2 10*3/uL — ABNORMAL HIGH (ref 0.7–4.0)
MCH: 31.5 pg (ref 26.0–34.0)
MCHC: 33.9 g/dL (ref 30.0–36.0)
MCV: 92.9 fL (ref 80.0–100.0)
Monocytes Absolute: 0.8 10*3/uL (ref 0.1–1.0)
Monocytes Relative: 2 %
Neutro Abs: 3.9 10*3/uL (ref 1.7–7.7)
Neutrophils Relative %: 10 %
Platelet Count: 286 10*3/uL (ref 150–400)
RBC: 4.64 MIL/uL (ref 3.87–5.11)
RDW: 12.9 % (ref 11.5–15.5)
Smear Review: NORMAL
WBC Count: 38.2 10*3/uL — ABNORMAL HIGH (ref 4.0–10.5)
nRBC: 0 % (ref 0.0–0.2)

## 2023-06-18 LAB — LACTATE DEHYDROGENASE: LDH: 162 U/L (ref 98–192)

## 2023-06-18 NOTE — Progress Notes (Signed)
HEMATOLOGY/ONCOLOGY CLINIC NOTE  Date of Service: 06/18/23   Patient Care Team: Mila Palmer, MD as PCP - General (Family Medicine)  CHIEF COMPLAINTS/PURPOSE OF CONSULTATION:  Follow-up for continued management of CLL  HISTORY OF PRESENTING ILLNESS:  Please see previous note for details on initial presentation.  Interval History:   Cheryl Sims is here for follow-up for continued evaluation and management of her CLL. Patient was last seen by me on 12/18/2022 and complained of frequent intense drenching night sweats.   Today, she reports that she has been doing well overall over the last 6 months. Patient reports that a recent bone density test did show findings of advanced osteoporosis. She currently takes fosamax orally once a week. Patient notes that after the first couple weeks of taking fosamax, she felt some aches.   Patient reports that she lifts weights regularly and she regularly consumes dairy in her diet. She reports having plenty of calcium in her diet. Patient reports that her vitamin D levels have always been on the low side. She takes 5000 units of vitamin D once a day. She reports that she is a vegetarian. She denies any acid reflux.   Patient denies any infection issues, new lumps/bumps, fever, or chills. She reports that she tends to sweat quite often.   She is UTD with her flu and COVID-19 vaccines. She reports that she has not received the RSV vaccine. She reports having a reaction to the shingles vaccine previously.    MEDICAL HISTORY:  Past Medical History:  Diagnosis Date   Anxiety    H. pylori infection    IBS (irritable bowel syndrome)    Peptic ulcer due to Helicobacter pylori     SURGICAL HISTORY: No past surgical history on file.  SOCIAL HISTORY: Social History   Socioeconomic History   Marital status: Married    Spouse name: Not on file   Number of children: Not on file   Years of education: Not on file   Highest education level: Not  on file  Occupational History   Not on file  Tobacco Use   Smoking status: Never   Smokeless tobacco: Never  Vaping Use   Vaping status: Never Used  Substance and Sexual Activity   Alcohol use: Yes   Drug use: Never   Sexual activity: Yes  Other Topics Concern   Not on file  Social History Narrative   Not on file   Social Determinants of Health   Financial Resource Strain: Not on file  Food Insecurity: Not on file  Transportation Needs: Not on file  Physical Activity: Not on file  Stress: Not on file  Social Connections: Not on file  Intimate Partner Violence: Not on file    FAMILY HISTORY: Family History  Problem Relation Age of Onset   Alzheimer's disease Father    Thyroid cancer Brother     ALLERGIES:  has No Known Allergies.  MEDICATIONS:  Current Outpatient Medications  Medication Sig Dispense Refill   ALPRAZolam (XANAX) 0.5 MG tablet TK 1/2 TO 1 T PO TID PRN  0   escitalopram (LEXAPRO) 10 MG tablet Take 10 mg by mouth daily.     No current facility-administered medications for this visit.    REVIEW OF SYSTEMS:    10 Point review of Systems was done is negative except as noted above.   PHYSICAL EXAMINATION: ECOG FS:1 - Symptomatic but completely ambulatory  Vitals:   06/18/23 1410  BP: 121/73  Pulse: 67  Resp: 18  Temp: (!) 97.2 F (36.2 C)  SpO2: 99%   Wt Readings from Last 3 Encounters:  12/18/22 140 lb 12.8 oz (63.9 kg)  05/20/22 136 lb 5 oz (61.8 kg)  10/15/21 141 lb (64 kg)   Body mass index is 21.4 kg/m.    GENERAL:alert, in no acute distress and comfortable SKIN: no acute rashes, no significant lesions EYES: conjunctiva are pink and non-injected, sclera anicteric OROPHARYNX: MMM, no exudates, no oropharyngeal erythema or ulceration NECK: supple, no JVD LYMPH:  no palpable lymphadenopathy in the cervical, axillary or inguinal regions LUNGS: clear to auscultation b/l with normal respiratory effort HEART: regular rate &  rhythm ABDOMEN:  normoactive bowel sounds , non tender, not distended. Extremity: no pedal edema PSYCH: alert & oriented x 3 with fluent speech NEURO: no focal motor/sensory deficits   LABORATORY DATA:  I have reviewed the data as listed  .    Latest Ref Rng & Units 06/18/2023    1:55 PM 12/18/2022    2:46 PM 05/20/2022    8:45 AM  CBC  WBC 4.0 - 10.5 K/uL 38.2  33.9  34.7   Hemoglobin 12.0 - 15.0 g/dL 56.2  13.0  86.5   Hematocrit 36.0 - 46.0 % 43.1  41.9  43.3   Platelets 150 - 400 K/uL 286  258  285     CBC    Component Value Date/Time   WBC 38.2 (H) 06/18/2023 1355   WBC 35.2 (H) 09/14/2021 0850   RBC 4.64 06/18/2023 1355   HGB 14.6 06/18/2023 1355   HCT 43.1 06/18/2023 1355   PLT 286 06/18/2023 1355   MCV 92.9 06/18/2023 1355   MCH 31.5 06/18/2023 1355   MCHC 33.9 06/18/2023 1355   RDW 12.9 06/18/2023 1355   LYMPHSABS 33.2 (H) 06/18/2023 1355   MONOABS 0.8 06/18/2023 1355   EOSABS 0.2 06/18/2023 1355   BASOSABS 0.1 06/18/2023 1355    .    Latest Ref Rng & Units 06/18/2023    1:55 PM 12/18/2022    2:46 PM 05/20/2022    8:45 AM  CMP  Glucose 70 - 99 mg/dL 97  93  68   BUN 6 - 20 mg/dL 9  13  11    Creatinine 0.44 - 1.00 mg/dL 7.84  6.96  2.95   Sodium 135 - 145 mmol/L 141  141  141   Potassium 3.5 - 5.1 mmol/L 4.1  3.9  4.8   Chloride 98 - 111 mmol/L 105  105  106   CO2 22 - 32 mmol/L 30  30  29    Calcium 8.9 - 10.3 mg/dL 9.5  9.3  9.8   Total Protein 6.5 - 8.1 g/dL 6.8  6.7  7.0   Total Bilirubin <1.2 mg/dL 0.5  0.4  0.8   Alkaline Phos 38 - 126 U/L 66  62  65   AST 15 - 41 U/L 23  20  19    ALT 0 - 44 U/L 16  12  14     . Lab Results  Component Value Date   LDH 162 06/18/2023     11/03/17 Peripheral Blood Flow Cytometry:   11/03/17 FISH, CLL Prognostic Panel:   RADIOGRAPHIC STUDIES: I have personally reviewed the radiological images as listed and agreed with the findings in the report. No results found.  ASSESSMENT & PLAN:   62 y.o. female  with  1. Chronic lymphocytic leukemia Rai stage 0 11/03/17 Peripheral blood flow cytometry positive for monoclonal  lymphoid population comprising 83% of lymphoid events, consistent with CLL.  11/03/17 CLL FISH panel negative for trisomy 12, p53 deletion or amplification, and ATM deletion. Positive for [id number] (36% with bi-allelic deletion) 11/13/17 CT N/C/A/P indicated increased number without pathological enlargement of LN in the neck, no pathological lymphadenopathy in the chest, abdomen of pelvis. No splenomegaly.   PLAN:  -Discussed lab results on 06/18/23 in detail with patient. CBC showed WBC of 38.2K, hemoglobin of 14.6, and platelets of 286K. -her WBC does tend to fluctuate and is not concerning at this time. There is no affect on hgb or platelets and she has no new symptoms otherwise.  -Patient notes no new constitutional symptoms or other symptoms of CLL progression at this time.  -Patient has no clinical symptoms or lab changes suggesting need for initiation of CLL treatment at this time.  -She will continue active surveillance.  -discussed options of prolia shot every 6 months or Reclast IV infusion once a year, which would generally be more effective than fosamax.  -recommend soy protein in the diet -recommend 1500 MG calcium in the diet or as replacement -discussed goal vitamin D level between 60-90 ng/mL to optimize how much calcium the body holds on to -continue to stay regularly active -recommend patient to stay UTD with her age-appropriate vaccines, including receiving the RSV vaccine. She has received the flu and new COVID-19 vaccines recently.  -given that her blood counts have been stable recently, we will plan to alternate visits with her PCP. Her next appointment with her PCP is in January, and we will plan for her next visit with [name] 6 months after that time.   FOLLOW UP: RTC with Dr Candise Che with labs in 8 months  The total time spent in the appointment was 21 minutes*  .  All of the patient's questions were answered with apparent satisfaction. The patient knows to call the clinic with any problems, questions or concerns.   Wyvonnia Lora MD MS AAHIVMS [facility] Hematology/Oncology Physician [facility]  .*Total Encounter Time as defined by the Centers for Medicare and Medicaid Services includes, in addition to the face-to-face time of a patient visit (documented in the note above) non-face-to-face time: obtaining and reviewing outside history, ordering and reviewing medications, tests or procedures, care coordination (communications with other health care professionals or caregivers) and documentation in the medical record.    I,Mitra Faeizi,acting as a Neurosurgeon for Wyvonnia Lora, MD.,have documented all relevant documentation on the behalf of Wyvonnia Lora, MD,as directed by  Wyvonnia Lora, MD while in the presence of Wyvonnia Lora, MD.  .I have reviewed the above documentation for accuracy and completeness, and I agree with the above. Johney Maine MD

## 2024-02-17 ENCOUNTER — Other Ambulatory Visit: Payer: Self-pay

## 2024-02-17 DIAGNOSIS — C9110 Chronic lymphocytic leukemia of B-cell type not having achieved remission: Secondary | ICD-10-CM

## 2024-02-18 ENCOUNTER — Inpatient Hospital Stay: Payer: Commercial Managed Care - PPO

## 2024-02-18 ENCOUNTER — Inpatient Hospital Stay: Payer: Commercial Managed Care - PPO | Admitting: Hematology

## 2024-02-18 ENCOUNTER — Telehealth: Payer: Self-pay | Admitting: Hematology

## 2024-03-01 NOTE — Progress Notes (Unsigned)
   LILLETTE Ileana Collet, PhD, LAT, ATC acting as a scribe for Artist Lloyd, MD.  Cheryl Sims is a 63 y.o. female who presents to [facility] today for L buttock pain. Pt was previously seen by Dr. Claudene on 10/15/21 for low back and L groin pain.  Today, pt c/o L buttock pain x ***. Pt locates pain to ***  Radiates: Aggravates: Treatments tried:  Dx imaging: 11/16/21 Pelvis & L-spine MRI  10/15/21 L hip & L-spine XR  Pertinent review of systems: ***  Relevant historical information: ***   Exam:  There were no vitals taken for this visit. General: Well Developed, well nourished, and in no acute distress.   MSK: ***    Lab and Radiology Results No results found for this or any previous visit (from the past 72 hours). No results found.     Assessment and Plan: 63 y.o. female with ***   PDMP not reviewed this encounter. No orders of the defined types were placed in this encounter.  No orders of the defined types were placed in this encounter.    Discussed warning signs or symptoms. Please see discharge instructions. Patient expresses understanding.   ***

## 2024-03-02 ENCOUNTER — Encounter: Payer: Self-pay | Admitting: Family Medicine

## 2024-03-02 ENCOUNTER — Ambulatory Visit (INDEPENDENT_AMBULATORY_CARE_PROVIDER_SITE_OTHER): Admitting: Family Medicine

## 2024-03-02 VITALS — BP 102/78 | HR 62 | Ht 69.0 in | Wt 139.0 lb

## 2024-03-02 DIAGNOSIS — M7918 Myalgia, other site: Secondary | ICD-10-CM

## 2024-03-02 NOTE — Patient Instructions (Signed)
 Thank you for coming in today.   A referral for physical therapy has been submitted. A representative from the physical therapy office will contact you to coordinate scheduling after confirming your benefits with your insurance provider. If you do not hear from the physical therapy office within the next 1-2 weeks, please let us  know.   See you back in 8 weeks.

## 2024-03-05 ENCOUNTER — Other Ambulatory Visit

## 2024-03-05 ENCOUNTER — Ambulatory Visit: Admitting: Hematology

## 2024-03-12 ENCOUNTER — Inpatient Hospital Stay: Attending: Hematology

## 2024-03-12 ENCOUNTER — Inpatient Hospital Stay (HOSPITAL_BASED_OUTPATIENT_CLINIC_OR_DEPARTMENT_OTHER): Admitting: Hematology

## 2024-03-12 VITALS — BP 122/77 | HR 58 | Temp 97.3°F | Resp 20 | Wt 139.3 lb

## 2024-03-12 DIAGNOSIS — Z79899 Other long term (current) drug therapy: Secondary | ICD-10-CM | POA: Diagnosis not present

## 2024-03-12 DIAGNOSIS — C9110 Chronic lymphocytic leukemia of B-cell type not having achieved remission: Secondary | ICD-10-CM

## 2024-03-12 LAB — CMP (CANCER CENTER ONLY)
ALT: 19 U/L (ref 0–44)
AST: 22 U/L (ref 15–41)
Albumin: 4.5 g/dL (ref 3.5–5.0)
Alkaline Phosphatase: 55 U/L (ref 38–126)
Anion gap: 4 — ABNORMAL LOW (ref 5–15)
BUN: 12 mg/dL (ref 8–23)
CO2: 30 mmol/L (ref 22–32)
Calcium: 9.3 mg/dL (ref 8.9–10.3)
Chloride: 105 mmol/L (ref 98–111)
Creatinine: 0.69 mg/dL (ref 0.44–1.00)
GFR, Estimated: >60 mL/min (ref >60)
Glucose, Bld: 81 mg/dL (ref 70–99)
Potassium: 4.8 mmol/L (ref 3.5–5.1)
Sodium: 139 mmol/L (ref 135–145)
Total Bilirubin: 0.6 mg/dL (ref 0.0–1.2)
Total Protein: 6.6 g/dL (ref 6.5–8.1)

## 2024-03-12 LAB — CBC WITH DIFFERENTIAL (CANCER CENTER ONLY)
Abs Immature Granulocytes: 0.07 K/uL (ref 0.00–0.07)
Basophils Absolute: 0.2 K/uL — ABNORMAL HIGH (ref 0.0–0.1)
Basophils Relative: 0 %
Eosinophils Absolute: 0.1 K/uL (ref 0.0–0.5)
Eosinophils Relative: 0 %
HCT: 42.8 % (ref 36.0–46.0)
Hemoglobin: 14.2 g/dL (ref 12.0–15.0)
Immature Granulocytes: 0 %
Lymphocytes Relative: 91 %
Lymphs Abs: 40.5 K/uL — ABNORMAL HIGH (ref 0.7–4.0)
MCH: 31.1 pg (ref 26.0–34.0)
MCHC: 33.2 g/dL (ref 30.0–36.0)
MCV: 93.9 fL (ref 80.0–100.0)
Monocytes Absolute: 0.8 K/uL (ref 0.1–1.0)
Monocytes Relative: 2 %
Neutro Abs: 3.4 K/uL (ref 1.7–7.7)
Neutrophils Relative %: 7 %
Platelet Count: 291 K/uL (ref 150–400)
RBC: 4.56 MIL/uL (ref 3.87–5.11)
RDW: 12.7 % (ref 11.5–15.5)
Smear Review: NORMAL
WBC Count: 45 K/uL — ABNORMAL HIGH (ref 4.0–10.5)
nRBC: 0 % (ref 0.0–0.2)

## 2024-03-12 LAB — LACTATE DEHYDROGENASE: LDH: 144 U/L (ref 98–192)

## 2024-03-15 ENCOUNTER — Telehealth: Payer: Self-pay | Admitting: Hematology

## 2024-03-15 NOTE — Telephone Encounter (Signed)
 I left a detailed voicemail for Cheryl Sims to return my call if she needs to re-schehdule her yearly follow up appointment scheduled on 03/15/2025.

## 2024-03-17 NOTE — Therapy (Signed)
 OUTPATIENT PHYSICAL THERAPY LOWER EXTREMITY EVALUATION   Patient Name: TAELOR WAYMIRE MRN: [id number] DOB:[birth year], 63 y.o., female Today's Date: 03/18/2024  END OF SESSION:  PT End of Session - 03/18/24 0758     Visit Number 1    Number of Visits 10    Date for PT Re-Evaluation 04/29/24    Authorization Type UHC UMR, NO COPAY, NO VL    Progress Note Due on Visit 10    PT Start Time 0802    PT Stop Time 0837    PT Time Calculation (min) 35 min    Activity Tolerance Patient tolerated treatment well;No increased pain    Behavior During Therapy WFL for tasks assessed/performed          Past Medical History:  Diagnosis Date   Anxiety    H. pylori infection    IBS (irritable bowel syndrome)    Peptic ulcer due to Helicobacter pylori    History reviewed. No pertinent surgical history. Patient Active Problem List   Diagnosis Date Noted   Low back pain 10/15/2021   CLL (chronic lymphocytic leukemia) (HCC) 12/07/2017    PCP: Norleen Jungling, MD  REFERRING PROVIDER: Artist GORMAN Lloyd, MD   REFERRING DIAG: M79.18 (ICD-10-CM) - Gluteal pain  THERAPY DIAG:  Gluteal pain  Muscle weakness (generalized)  Other abnormalities of gait and mobility  Pain in left hip  Rationale for Evaluation and Treatment: Rehabilitation  ONSET DATE: February 21, 2024  SUBJECTIVE:   SUBJECTIVE STATEMENT: I am a lifelong runner, but I did a lot of weight lifting for a while and started having soreness after deep squats.   PERTINENT HISTORY: Patient is an active female with osteoporosis (on medication) that has experienced a partial tear in the left piriformis muscle following increased strength training (to include deep squats with dumbbells). Patient notes minimal/moderate residual pain in upper/lateral left glute, but low on the pain scale. Patient also endorses that she fractured her left foot when she was 22 and never appropriately followed up with medical care. Patient does not perform warm up or  cool down when she runs, which she has not performed since injury. PAIN:  Are you having pain? Yes: NPRS scale: 0/10 when at rest, 1-2/10 at most  Pain location: upper glute area, lateral glute   Pain description: tenderness Aggravating factors: running Relieving factors: ibuprofen, ice   PRECAUTIONS: None  RED FLAGS: None   WEIGHT BEARING RESTRICTIONS: No  FALLS:  Has patient fallen in last 6 months? No  LIVING ENVIRONMENT: Lives with: lives with their family, lives with their spouse, and lives with their daughter Lives in: House/apartment Stairs: Yes: Internal: 15 steps; on right going up Has following equipment at home: Single point cane, Quad cane small base, Walker - 2 wheeled, Crutches, and Wheelchair (manual)  OCCUPATION: fundraiser for school  PLOF: Independent  PATIENT GOALS: get back to running  NEXT MD VISIT: April 27, 2024  OBJECTIVE:  Note: Objective measures were completed at Evaluation unless otherwise noted.  DIAGNOSTIC FINDINGS:   PATIENT SURVEYS:  PSFS: THE PATIENT SPECIFIC FUNCTIONAL SCALE  Place score of 0-10 (0 = unable to perform activity and 10 = able to perform activity at the same level as before injury or problem)  Activity Date: 03/18/2024    Running  0    2.     3.     4.      Total Score 0      Total Score = Sum of activity scores/number  of activities  Minimally Detectable Change: 3 points (for single activity); 2 points (for average score)  [facility] (nd). The Patient Specific Functional Scale . Retrieved from [email or web address]   COGNITION: Overall cognitive status: Within functional limits for tasks assessed     SENSATION: WFL  EDEMA:    MUSCLE LENGTH: Not formally assessed, though bilat hamstrings are tight   POSTURE: rounded shoulders  PALPATION: No abnormalities noted   LOWER EXTREMITY ROM:  ROM Right Eval 03/18/2024  Left Eval 03/18/2024  Hip flexion 110/120 125  Hip extension    Hip abduction    Hip adduction    Hip internal rotation    Hip external rotation    Knee flexion    Knee extension    Ankle dorsiflexion    Ankle plantarflexion    Ankle inversion    Ankle eversion     (Blank rows = not tested)  LOWER EXTREMITY MMT:  MMT Right Eval 03/18/2024 Left Eval 03/18/2024  Hip flexion 5/5 4/5  Hip extension 4/5 4/5  Hip abduction 4+/5 4+/5  Hip adduction    Hip internal rotation    Hip external rotation    Knee flexion 5/5 5/5  Knee extension 5/5 5/5  Ankle dorsiflexion    Ankle plantarflexion    Ankle inversion    Ankle eversion     (Blank rows = not tested)  LOWER EXTREMITY SPECIAL TESTS:  Hip special tests: Belvie (FABER) test: negative and FADIR negative   FUNCTIONAL TESTS:  Not formally assessed on eval   GAIT: Distance walked: not formally assessed  Assistive device utilized: None Level of assistance: Complete Independence Comments:                                                                                                                                 TREATMENT DATE:  03/18/2024 TherEx:  HEP handout provided with patient performing one set of each exercise for appropriate form. Verbal and tactile cues provided.     PATIENT EDUCATION:  Education details: HEP, tissue recovery, osteoporosis and crunches  Person educated: Patient Education method: Explanation, Demonstration, Verbal cues, and Handouts Education comprehension: verbalized understanding and returned demonstration  HOME EXERCISE PROGRAM:// Access Code: [email or web address] URL: [email or web address] Date: 03/18/2024 Prepared by: Susannah Daring  Exercises - Seated Figure 4 Piriformis Stretch  - 1 x daily - 7 x weekly - 2 sets - 30s hold - Seated Hamstring Stretch  - 1 x daily - 7 x weekly - 2 sets - 30s hold - Mini Squat with Counter Support  - 1 x daily - 7 x weekly - 2 sets - 10 reps -  Side Stepping with Resistance at Thighs  - 1 x daily - 7 x weekly - 2 sets - 10 reps - Heel Raises with Counter Support  - 1 x daily - 7 x weekly - 2 sets - 10 reps - 2s hold - Standing Gastroc Stretch on  Step  - 1 x daily - 7 x weekly - 2 sets - 30s hold  ASSESSMENT:  CLINICAL IMPRESSION: Patient is a 63 y.o. F who was seen today for physical therapy evaluation and treatment for gluteal pain with deficits in strength, functional mobility, and pain. Patient notes being a highly active individual who is motivated to return to normal activities without pain. Patient is most limited by pain with increased activity and fear. Patient will benefit from skilled PT to address above noted deficits.   OBJECTIVE IMPAIRMENTS: decreased activity tolerance, decreased mobility, decreased ROM, decreased strength, impaired flexibility, and pain.   ACTIVITY LIMITATIONS: squatting, stairs, and running  PARTICIPATION LIMITATIONS: community activity  PERSONAL FACTORS: 3+ comorbidities: anxiety, CLL, osteoporosis are also affecting patient's functional outcome.   REHAB POTENTIAL: Excellent  CLINICAL DECISION MAKING: Stable/uncomplicated  EVALUATION COMPLEXITY: Low   GOALS: Goals reviewed with patient? Yes  SHORT TERM GOALS: Target date: 04/08/2024 Patient will be compliant with initial HEP.  Baseline: Goal status: INITIAL    LONG TERM GOALS: Target date: 04/29/2024  Patient will be independent with final HEP in order to maintain and progress upon functional gains made within PT.  Baseline:  Goal status: INITIAL  2.  Patient will report pain levels no greater than 0/10 to show improved overall quality of life.  Baseline:  Goal status: INITIAL  3.  Patient will increase PSFS to at least 2 to show a significant improvement in subjective disability rating. Baseline:  Goal status: INITIAL  4.  Patient will improve bilat hip extension MMT to at least 5/5 in order to show improved biomechanics with  functional mobility. Baseline:  Goal status: INITIAL  5.  Patient will endorse ability to go for a one mile walk/run without noted limping. Baseline:  Goal status: INITIAL    PLAN:  PT FREQUENCY: 1x/week  PT DURATION: 6 weeks  PLANNED INTERVENTIONS: 97164- PT Re-evaluation, 97750- Physical Performance Testing, 97110-Therapeutic exercises, 97530- Therapeutic activity, [id number]- Neuromuscular re-education, 97535- Self Care, [id number]- Manual therapy, [phone]- Gait training, [phone]- Electrical stimulation (unattended), [phone]- Electrical stimulation (manual), [id number]- Vasopneumatic device, [id number]- Ultrasound, [id number]- Traction (mechanical), [id number]- Ionotophoresis 4mg /ml Dexamethasone, 79439 (1-2 muscles), 20561 (3+ muscles)- Dry Needling, Patient/Family education, Balance training, Stair training, Taping, Joint mobilization, Joint manipulation, Spinal manipulation, Spinal mobilization, Scar mobilization, DME instructions, Cryotherapy, and Moist heat  PLAN FOR NEXT SESSION: reassess HEP, go over safe core exercises for patients with osteoporosis, generalized strengthening and flexibility, overview of appropriate warm-up/cool-down for exercise    Susannah Daring, PT, DPT 03/18/24 10:49 AM

## 2024-03-18 ENCOUNTER — Ambulatory Visit (INDEPENDENT_AMBULATORY_CARE_PROVIDER_SITE_OTHER)

## 2024-03-18 DIAGNOSIS — M25552 Pain in left hip: Secondary | ICD-10-CM

## 2024-03-18 DIAGNOSIS — M6281 Muscle weakness (generalized): Secondary | ICD-10-CM

## 2024-03-18 DIAGNOSIS — M7918 Myalgia, other site: Secondary | ICD-10-CM

## 2024-03-18 DIAGNOSIS — R2689 Other abnormalities of gait and mobility: Secondary | ICD-10-CM

## 2024-03-19 NOTE — Progress Notes (Signed)
 HEMATOLOGY/ONCOLOGY CLINIC NOTE  Date of Service: .03/12/2024   Patient Care Team: [name], MD as PCP - General (Internal Medicine)  CHIEF COMPLAINTS/PURPOSE OF CONSULTATION:  Follow-up for continued valuation and management of CLL  HISTORY OF PRESENTING ILLNESS:  Please see previous note for details on initial presentation.  Interval History:   Cheryl Sims is here for continued valuation management of her CLL. She notes no acute new symptoms since her last clinic visit. No fevers no chills no night sweats no unexpected weight loss.  No new lumps or bumps.  No new skin lesions.. No abdominal pain or distention. Labs done today were discussed in detail with the patient.  MEDICAL HISTORY:  Past Medical History:  Diagnosis Date   Anxiety    H. pylori infection    IBS (irritable bowel syndrome)    Peptic ulcer due to Helicobacter pylori     SURGICAL HISTORY: No past surgical history on file.  SOCIAL HISTORY: Social History   Socioeconomic History   Marital status: Married    Spouse name: Not on file   Number of children: Not on file   Years of education: Not on file   Highest education level: Not on file  Occupational History   Not on file  Tobacco Use   Smoking status: Never   Smokeless tobacco: Never  Vaping Use   Vaping status: Never Used  Substance and Sexual Activity   Alcohol use: Yes   Drug use: Never   Sexual activity: Yes  Other Topics Concern   Not on file  Social History Narrative   Not on file   Social Drivers of Health   Financial Resource Strain: Not on file  Food Insecurity: Not on file  Transportation Needs: Not on file  Physical Activity: Not on file  Stress: Not on file  Social Connections: Not on file  Intimate Partner Violence: Not on file    FAMILY HISTORY: Family History  Problem Relation Age of Onset   Alzheimer's disease Father    Thyroid cancer Brother     ALLERGIES:  has no known allergies.  MEDICATIONS:   Current Outpatient Medications  Medication Sig Dispense Refill   ALPRAZolam (XANAX) 0.5 MG tablet TK 1/2 TO 1 T PO TID PRN  0   escitalopram (LEXAPRO) 10 MG tablet Take 10 mg by mouth daily.     No current facility-administered medications for this visit.    REVIEW OF SYSTEMS:    10 Point review of Systems was done is negative except as noted above.   PHYSICAL EXAMINATION: ECOG FS:1 - Symptomatic but completely ambulatory  Vitals:   03/12/24 1158  BP: 122/77  Pulse: (!) 58  Resp: 20  Temp: (!) 97.3 F (36.3 C)  SpO2: 97%   Wt Readings from Last 3 Encounters:  03/12/24 139 lb 4.8 oz (63.2 kg)  03/02/24 139 lb (63 kg)  06/18/23 144 lb 14.4 oz (65.7 kg)   Body mass index is 20.57 kg/m.    GENERAL:alert, in no acute distress and comfortable SKIN: no acute rashes, no significant lesions EYES: conjunctiva are pink and non-injected, sclera anicteric OROPHARYNX: MMM, no exudates, no oropharyngeal erythema or ulceration NECK: supple, no JVD LYMPH:  no palpable lymphadenopathy in the cervical, axillary or inguinal regions LUNGS: clear to auscultation b/l with normal respiratory effort HEART: regular rate & rhythm ABDOMEN:  normoactive bowel sounds , non tender, not distended. Extremity: no pedal edema PSYCH: alert & oriented x 3 with fluent speech  NEURO: no focal motor/sensory deficits   LABORATORY DATA:  I have reviewed the data as listed  .    Latest Ref Rng & Units 03/12/2024   11:43 AM 06/18/2023    1:55 PM 12/18/2022    2:46 PM  CBC  WBC 4.0 - 10.5 K/uL 45.0  38.2  33.9   Hemoglobin 12.0 - 15.0 g/dL 85.7  85.3  86.0   Hematocrit 36.0 - 46.0 % 42.8  43.1  41.9   Platelets 150 - 400 K/uL 291  286  258     CBC    Component Value Date/Time   WBC 45.0 (H) 03/12/2024 1143   WBC 35.2 (H) 09/14/2021 0850   RBC 4.56 03/12/2024 1143   HGB 14.2 03/12/2024 1143   HCT 42.8 03/12/2024 1143   PLT 291 03/12/2024 1143   MCV 93.9 03/12/2024 1143   MCH 31.1 03/12/2024  1143   MCHC 33.2 03/12/2024 1143   RDW 12.7 03/12/2024 1143   LYMPHSABS 40.5 (H) 03/12/2024 1143   MONOABS 0.8 03/12/2024 1143   EOSABS 0.1 03/12/2024 1143   BASOSABS 0.2 (H) 03/12/2024 1143    .    Latest Ref Rng & Units 03/12/2024   11:43 AM 06/18/2023    1:55 PM 12/18/2022    2:46 PM  CMP  Glucose 70 - 99 mg/dL 81  97  93   BUN 8 - 23 mg/dL 12  9  13    Creatinine 0.44 - 1.00 mg/dL 9.30  9.36  9.31   Sodium 135 - 145 mmol/L 139  141  141   Potassium 3.5 - 5.1 mmol/L 4.8  4.1  3.9   Chloride 98 - 111 mmol/L 105  105  105   CO2 22 - 32 mmol/L 30  30  30    Calcium 8.9 - 10.3 mg/dL 9.3  9.5  9.3   Total Protein 6.5 - 8.1 g/dL 6.6  6.8  6.7   Total Bilirubin 0.0 - 1.2 mg/dL 0.6  0.5  0.4   Alkaline Phos 38 - 126 U/L 55  66  62   AST 15 - 41 U/L 22  23  20    ALT 0 - 44 U/L 19  16  12     . Lab Results  Component Value Date   LDH 144 03/12/2024     11/03/17 Peripheral Blood Flow Cytometry:   11/03/17 FISH, CLL Prognostic Panel:   RADIOGRAPHIC STUDIES: I have personally reviewed the radiological images as listed and agreed with the findings in the report. No results found.  ASSESSMENT & PLAN:   63 y.o. female with  1. Chronic lymphocytic leukemia Rai stage 0 11/03/17 Peripheral blood flow cytometry positive for monoclonal lymphoid population comprising 83% of lymphoid events, consistent with CLL.  11/03/17 CLL FISH panel negative for trisomy 12, p53 deletion or amplification, and ATM deletion. Positive for [id number] (36% with bi-allelic deletion) 11/13/17 CT N/C/A/P indicated increased number without pathological enlargement of LN in the neck, no pathological lymphadenopathy in the chest, abdomen of pelvis. No splenomegaly.   PLAN:  -Discussed lab results on on 03/12/2024 in detail with the patient CBC shows gradually increasing WBC count up from 38.2k up to 45k in 8 months.  CBC shows stable hemoglobin of 14.2 which is normal and normal platelets of 291k CMP stable LDH is  within normal limits at 144 Patient has no evidence of more rapid progression of her CLL. No constitutional symptoms no cytopenias no significant splenomegaly, no other overt symptoms suggestive  of symptomatic CLL progression at this time requiring treatment. Follow-up with PCP for age-appropriate cancer screening, skin screening and.  Appropriate vaccinations Follow-up with PCP in 6 months with labs  FOLLOW UP: RTC with Dr Onesimo with labs in 12 months    The total time spent in the appointment was 20 minutes*.  All of the patient's questions were answered with apparent satisfaction. The patient knows to call the clinic with any problems, questions or concerns.   Emaline Onesimo MD MS AAHIVMS [facility] Hematology/Oncology Physician [facility]  .*Total Encounter Time as defined by the Centers for Medicare and Medicaid Services includes, in addition to the face-to-face time of a patient visit (documented in the note above) non-face-to-face time: obtaining and reviewing outside history, ordering and reviewing medications, tests or procedures, care coordination (communications with other health care professionals or caregivers) and documentation in the medical record.

## 2024-04-14 ENCOUNTER — Encounter

## 2024-04-23 ENCOUNTER — Encounter

## 2024-04-27 ENCOUNTER — Ambulatory Visit: Admitting: Family Medicine

## 2025-03-15 ENCOUNTER — Ambulatory Visit: Admitting: Hematology

## 2025-03-15 ENCOUNTER — Other Ambulatory Visit
